# Patient Record
Sex: Female | Born: 1960 | Race: White | Hispanic: No | Marital: Married
Health system: Southern US, Community
[De-identification: ages and names within clinical notes are randomized; demographics above are authoritative.]

---

## 2004-07-31 ENCOUNTER — Ambulatory Visit (HOSPITAL_COMMUNITY): Admission: RE | Admit: 2004-07-31 | Discharge: 2004-07-31 | Payer: Self-pay | Admitting: Neurological Surgery

## 2004-08-29 ENCOUNTER — Ambulatory Visit: Payer: Self-pay | Admitting: Physical Medicine & Rehabilitation

## 2004-08-29 ENCOUNTER — Inpatient Hospital Stay (HOSPITAL_COMMUNITY): Admission: RE | Admit: 2004-08-29 | Discharge: 2004-09-04 | Payer: Self-pay | Admitting: Neurological Surgery

## 2004-09-04 ENCOUNTER — Inpatient Hospital Stay (HOSPITAL_COMMUNITY)
Admission: RE | Admit: 2004-09-04 | Discharge: 2004-09-10 | Payer: Self-pay | Admitting: Physical Medicine & Rehabilitation

## 2004-09-19 ENCOUNTER — Ambulatory Visit: Payer: Self-pay | Admitting: Internal Medicine

## 2004-09-19 ENCOUNTER — Inpatient Hospital Stay (HOSPITAL_COMMUNITY): Admission: AD | Admit: 2004-09-19 | Discharge: 2004-09-25 | Payer: Self-pay | Admitting: Neurological Surgery

## 2005-10-05 ENCOUNTER — Ambulatory Visit: Payer: Self-pay | Admitting: Family Medicine

## 2006-02-09 ENCOUNTER — Encounter: Admission: RE | Admit: 2006-02-09 | Discharge: 2006-02-09 | Payer: Self-pay | Admitting: Anesthesiology

## 2006-03-10 ENCOUNTER — Ambulatory Visit: Payer: Self-pay | Admitting: Oncology

## 2006-05-26 ENCOUNTER — Ambulatory Visit: Payer: Self-pay | Admitting: Oncology

## 2006-09-22 ENCOUNTER — Ambulatory Visit: Payer: Self-pay | Admitting: Oncology

## 2006-11-17 ENCOUNTER — Ambulatory Visit: Payer: Self-pay | Admitting: Oncology

## 2007-01-26 ENCOUNTER — Ambulatory Visit: Payer: Self-pay | Admitting: Oncology

## 2007-07-13 ENCOUNTER — Ambulatory Visit: Payer: Self-pay | Admitting: Oncology

## 2007-07-13 ENCOUNTER — Ambulatory Visit (HOSPITAL_COMMUNITY): Admission: RE | Admit: 2007-07-13 | Discharge: 2007-07-13 | Payer: Self-pay | Admitting: Anesthesiology

## 2008-04-20 ENCOUNTER — Encounter: Admission: RE | Admit: 2008-04-20 | Discharge: 2008-04-20 | Payer: Self-pay | Admitting: Anesthesiology

## 2010-03-26 ENCOUNTER — Inpatient Hospital Stay (HOSPITAL_COMMUNITY): Admission: RE | Admit: 2010-03-26 | Discharge: 2010-04-04 | Payer: Self-pay | Admitting: Neurological Surgery

## 2010-03-28 ENCOUNTER — Ambulatory Visit: Payer: Self-pay | Admitting: Physical Medicine & Rehabilitation

## 2010-11-10 ENCOUNTER — Encounter: Payer: Self-pay | Admitting: Anesthesiology

## 2010-12-27 IMAGING — RF DG LUMBAR SPINE 2-3V
1 series · 2 of 2 positions shown · non-contrast
Comparison: 07/13/2007

CLINICAL DATA: L3-5 PLIF.

LUMBAR SPINE - 2-3 VIEW

[Series 1: run · 2 of 2 slices shown]
[im 1/2]
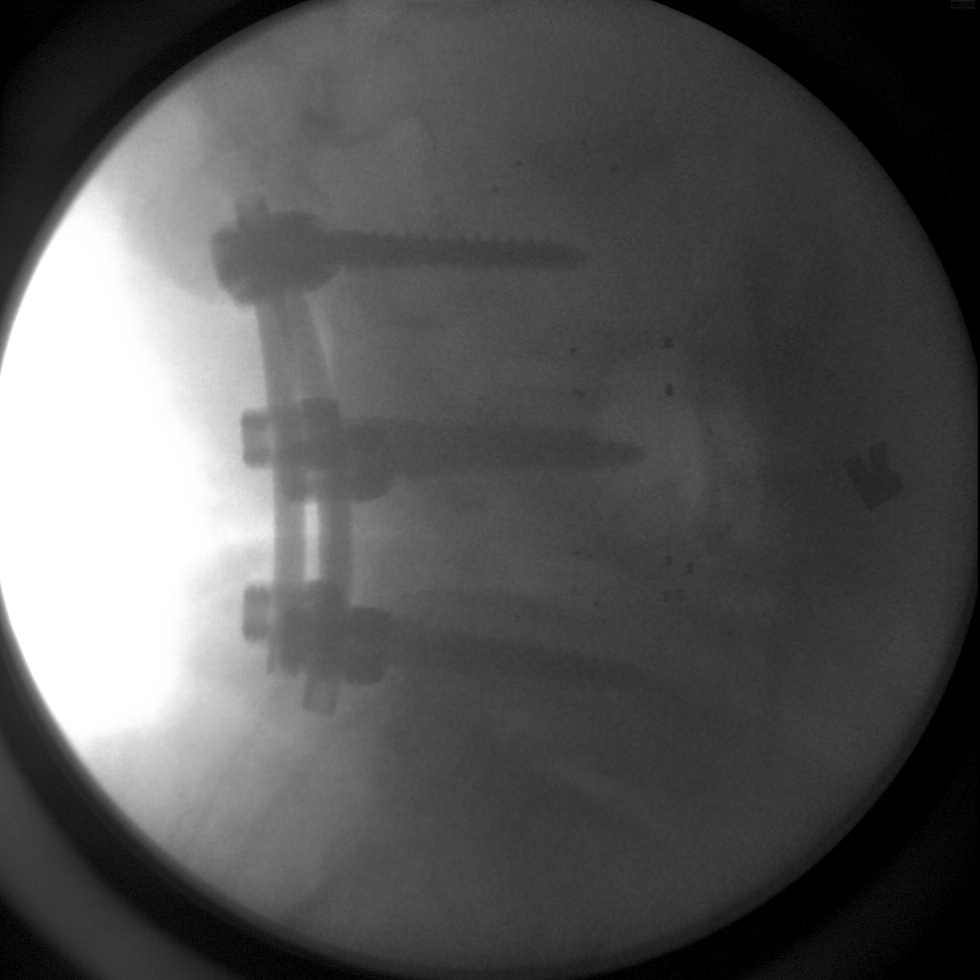
[im 2/2]
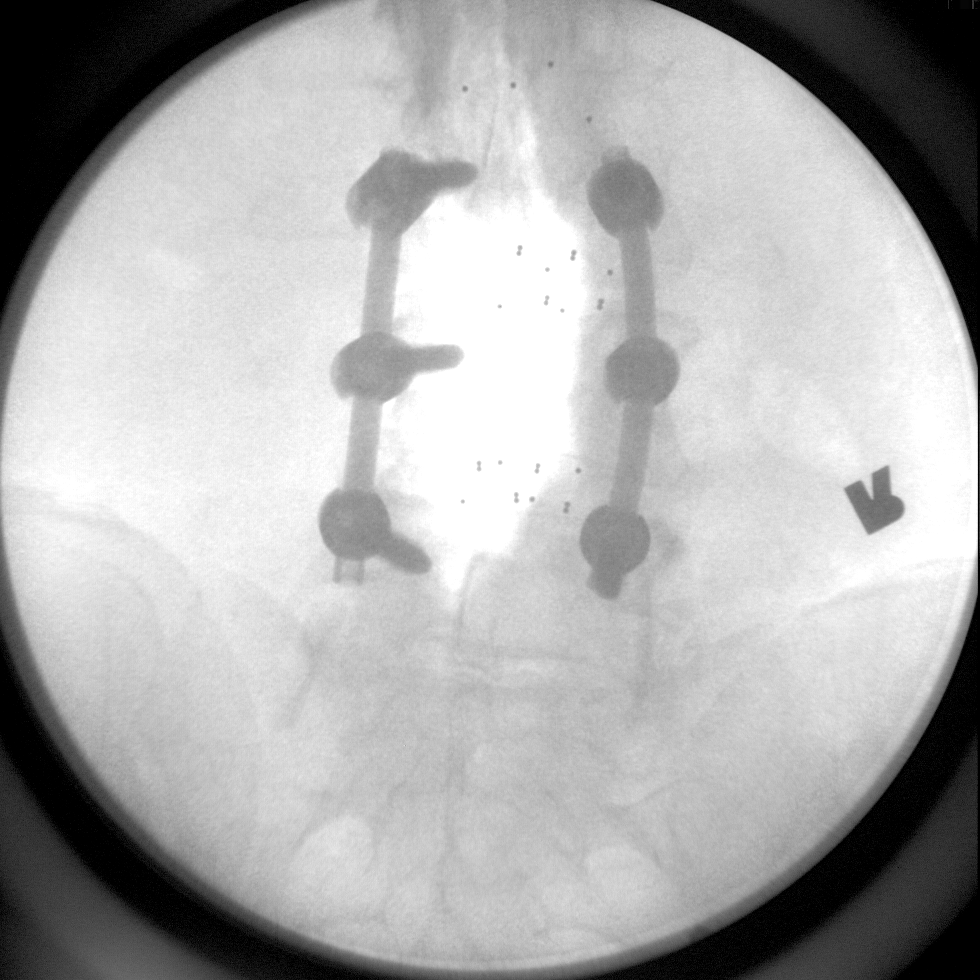

[2 of 2 positions shown; findings below may reference images not displayed]

FINDINGS: Two intraoperative fluoroscopic spot views of the lumbar
spine are submitted.  There is a three-level fusion, which appears
to be at L3-L5.  Interbody spacers are seen at L2-3 through L4-5.
IMPRESSION: L3-5 fusion.  Osseous detail is degraded by technique.

## 2011-01-05 LAB — HEPARIN LEVEL (UNFRACTIONATED): Heparin Unfractionated: <0.1 IU/mL — ABNORMAL LOW (ref 0.30–0.70)

## 2011-01-06 LAB — BASIC METABOLIC PANEL
BUN: 5 mg/dL — ABNORMAL LOW (ref 6–23)
BUN: 7 mg/dL (ref 6–23)
BUN: 6 mg/dL (ref 6–23)
CO2: 27 mEq/L (ref 19–32)
CO2: 33 mEq/L — ABNORMAL HIGH (ref 19–32)
CO2: 33 mEq/L — ABNORMAL HIGH (ref 19–32)
Calcium: 8 mg/dL — ABNORMAL LOW (ref 8.4–10.5)
Calcium: 8.5 mg/dL (ref 8.4–10.5)
Calcium: 9.2 mg/dL (ref 8.4–10.5)
Chloride: 102 mEq/L (ref 96–112)
Chloride: 103 mEq/L (ref 96–112)
Chloride: 96 mEq/L (ref 96–112)
Creatinine, Ser: 0.56 mg/dL (ref 0.4–1.2)
Creatinine, Ser: 0.57 mg/dL (ref 0.4–1.2)
Creatinine, Ser: 0.8 mg/dL (ref 0.4–1.2)
GFR calc Af Amer: >60 mL/min (ref >60)
GFR calc Af Amer: >60 mL/min (ref >60)
GFR calc Af Amer: >60 mL/min (ref >60)
GFR calc non Af Amer: >60 mL/min (ref >60)
GFR calc non Af Amer: >60 mL/min (ref >60)
GFR calc non Af Amer: >60 mL/min (ref >60)
Glucose, Bld: 116 mg/dL — ABNORMAL HIGH (ref 70–99)
Glucose, Bld: 125 mg/dL — ABNORMAL HIGH (ref 70–99)
Glucose, Bld: 109 mg/dL — ABNORMAL HIGH (ref 70–99)
Potassium: 3.7 mEq/L (ref 3.5–5.1)
Potassium: 4.5 mEq/L (ref 3.5–5.1)
Potassium: 3.8 mEq/L (ref 3.5–5.1)
Sodium: 138 mEq/L (ref 135–145)
Sodium: 138 mEq/L (ref 135–145)
Sodium: 135 mEq/L (ref 135–145)

## 2011-01-06 LAB — TYPE AND SCREEN
ABO/RH(D): A POS
Antibody Screen: NEGATIVE

## 2011-01-06 LAB — CBC
HCT: 28 % — ABNORMAL LOW (ref 36.0–46.0)
HCT: 28 % — ABNORMAL LOW (ref 36.0–46.0)
HCT: 28.3 % — ABNORMAL LOW (ref 36.0–46.0)
HCT: 29.8 % — ABNORMAL LOW (ref 36.0–46.0)
HCT: 33.3 % — ABNORMAL LOW (ref 36.0–46.0)
HCT: 40.9 % (ref 36.0–46.0)
Hemoglobin: 10.1 g/dL — ABNORMAL LOW (ref 12.0–15.0)
Hemoglobin: 11.8 g/dL — ABNORMAL LOW (ref 12.0–15.0)
Hemoglobin: 9.5 g/dL — ABNORMAL LOW (ref 12.0–15.0)
Hemoglobin: 9.6 g/dL — ABNORMAL LOW (ref 12.0–15.0)
Hemoglobin: 9.7 g/dL — ABNORMAL LOW (ref 12.0–15.0)
Hemoglobin: 14.1 g/dL (ref 12.0–15.0)
MCHC: 33.8 g/dL (ref 30.0–36.0)
MCHC: 33.8 g/dL (ref 30.0–36.0)
MCHC: 34 g/dL (ref 30.0–36.0)
MCHC: 34.6 g/dL (ref 30.0–36.0)
MCHC: 35.3 g/dL (ref 30.0–36.0)
MCHC: 34.4 g/dL (ref 30.0–36.0)
MCV: 94 fL (ref 78.0–100.0)
MCV: 95.2 fL (ref 78.0–100.0)
MCV: 95.3 fL (ref 78.0–100.0)
MCV: 95.7 fL (ref 78.0–100.0)
MCV: 96.1 fL (ref 78.0–100.0)
MCV: 96.1 fL (ref 78.0–100.0)
Platelets: 57 10*3/uL — ABNORMAL LOW (ref 150–400)
Platelets: 63 10*3/uL — ABNORMAL LOW (ref 150–400)
Platelets: 79 10*3/uL — ABNORMAL LOW (ref 150–400)
Platelets: 83 10*3/uL — ABNORMAL LOW (ref 150–400)
Platelets: 85 10*3/uL — ABNORMAL LOW (ref 150–400)
Platelets: 67 10*3/uL — ABNORMAL LOW (ref 150–400)
RBC: 2.93 MIL/uL — ABNORMAL LOW (ref 3.87–5.11)
RBC: 2.94 MIL/uL — ABNORMAL LOW (ref 3.87–5.11)
RBC: 2.95 MIL/uL — ABNORMAL LOW (ref 3.87–5.11)
RBC: 3.13 MIL/uL — ABNORMAL LOW (ref 3.87–5.11)
RBC: 3.54 MIL/uL — ABNORMAL LOW (ref 3.87–5.11)
RBC: 4.25 MIL/uL (ref 3.87–5.11)
RDW: 15.1 % (ref 11.5–15.5)
RDW: 15.1 % (ref 11.5–15.5)
RDW: 15.4 % (ref 11.5–15.5)
RDW: 15.5 % (ref 11.5–15.5)
RDW: 15.5 % (ref 11.5–15.5)
RDW: 15.6 % — ABNORMAL HIGH (ref 11.5–15.5)
WBC: 3.6 10*3/uL — ABNORMAL LOW (ref 4.0–10.5)
WBC: 4.2 10*3/uL (ref 4.0–10.5)
WBC: 4.3 10*3/uL (ref 4.0–10.5)
WBC: 4.5 10*3/uL (ref 4.0–10.5)
WBC: 5.8 10*3/uL (ref 4.0–10.5)
WBC: 4.8 10*3/uL (ref 4.0–10.5)

## 2011-01-06 LAB — PREPARE PLATELETS

## 2011-01-06 LAB — ABO/RH: ABO/RH(D): A POS

## 2011-01-06 LAB — SURGICAL PCR SCREEN
MRSA, PCR: NEGATIVE
Staphylococcus aureus: NEGATIVE

## 2011-01-06 LAB — POCT I-STAT 4, (NA,K, GLUC, HGB,HCT)
Glucose, Bld: 136 mg/dL — ABNORMAL HIGH (ref 70–99)
HCT: 31 % — ABNORMAL LOW (ref 36.0–46.0)
Hemoglobin: 10.5 g/dL — ABNORMAL LOW (ref 12.0–15.0)
Potassium: 4.3 mEq/L (ref 3.5–5.1)
Sodium: 141 mEq/L (ref 135–145)

## 2011-01-06 LAB — HEPATIC FUNCTION PANEL
ALT: 58 U/L — ABNORMAL HIGH (ref 0–35)
AST: 57 U/L — ABNORMAL HIGH (ref 0–37)
Albumin: 3.5 g/dL (ref 3.5–5.2)
Alkaline Phosphatase: 77 U/L (ref 39–117)
Bilirubin, Direct: <0.1 mg/dL (ref 0.0–0.3)
Total Bilirubin: 0.2 mg/dL — ABNORMAL LOW (ref 0.3–1.2)
Total Protein: 7.1 g/dL (ref 6.0–8.3)

## 2011-03-07 NOTE — Discharge Summary (Signed)
NAMEMARISHKA, [name]             ACCOUNT NO.:  [id number]   MEDICAL RECORD NO.:  [id number]          PATIENT TYPE:  INP   LOCATION:  3038                         FACILITY:  [facility]   PHYSICIAN:  Stefani Dama, M.D.  DATE OF BIRTH:  January 12, 1961   DATE OF ADMISSION:  09/19/2004  DATE OF DISCHARGE:  09/25/2004                                 DISCHARGE SUMMARY   ADMISSION DIAGNOSES:  1.  Status post lumbar decompression and arthrodesis at T12, L1, L3 and L4.  2.  Methicillin-sensitive Staphylococcus aureus wound infection.   MAJOR OPERATION:  Debridement of superficial wound infection on September 19, 2004.   CONDITION ON DISCHARGE:  Improving.   HOSPITAL COURSE:  [name] is a 50 year old individual who underwent  surgical decompression of T12-L1 and L3-L4 on the 11th of November.  She  tolerated that procedure well though she had a prolonged hospital stay  because she is quite debilitated; however, she was discharged home  ambulating independently on oral pain medications.  She has been on a  methadone maintenance program and has a significant history of Crohn's  disease and was maintained on Imuran through the time of the surgery.  Postoperatively, the patient developed some redness around the area of the  incision and I had seen her in the office on the 30th of November and it was  noted that she had some superficial cellulitis.  I started her on some  antibiotics; however, the next day she noted some drainage from the wound  and I advised admission to the hospital. After surgical debridement of the  wound, I noted that only the superficial tissues were involved.  She was  treated with a VAC dressing.  For the first 3 days postoperatively, she was  maintained on vancomycin.  Cultures ultimately returned methicillin-  sensitive Staphylococcus aureus.  She was changed to Ancef.  Dressing  changes were done daily for debridement purposes; however, at this time it  is felt that  the patient is a candidate for calcium alginate dressings which  will be performed at home.  A PICC line has been placed and she will be on  Ancef 1 g IV q.6h. for the next 6 weeks period of time.  She will be seen in  the office over the next 2 weeks time for a further checkup.  She has been  advised as to her activities.  It should be noted that she did not have  evidence of an elevated white count at the time of admission.  Her  sedimentation rate was noted to be 66 at the time of admission.  She has  remained afebrile without a white count during this hospitalization.      Henr   HJE/MEDQ  D:  09/25/2004  T:  09/25/2004  Job:  [id number]

## 2011-03-07 NOTE — Op Note (Signed)
[name], [name]             ACCOUNT NO.:  [id number]   MEDICAL RECORD NO.:  [id number]          PATIENT TYPE:  INP   LOCATION:  3038                         FACILITY:  [facility]   PHYSICIAN:  Stefani Dama, M.D.  DATE OF BIRTH:  May 02, 1961   DATE OF PROCEDURE:  09/19/2004  DATE OF DISCHARGE:                                 OPERATIVE REPORT   PREOPERATIVE DIAGNOSIS:  Wound infection status post lumbar decompressive  surgery on August 29, 2004.   PROCEDURE:  Debridement of superficial lumbar wound.   SURGEON:  Stefani Dama, M.D.   ANESTHESIA:  General endotracheal anesthesia.   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  She tolerated the  procedure well.  She did, however, require a stay in the rehabilitation unit  as the patient was slow to mobilize and ambulate independently.  In any  event, she gradually improved and was discharged home on September 11, 2004.  She was seen in the office on September 18, 2004, and it appeared that in the  mid portion of her incision she had some erythema suggesting some  superficial cellulitis and started her Keflex at that time.  She called  today indicating that she was having drainage from the mid portion of the  lumbar spine and I advised that she be admitted to the hospital. She is now  taken to the operating room to undergo surgical debridement of what appears  to be a superficial wound infection.  She has not had any fever, though she  has had an increased amount of back pain in the very recent past.  Some  laboratory work is still pending at this time.   DESCRIPTION OF PROCEDURE:  The patient was brought to the operating room  supine on the stretcher.  After the smooth induction of general endotracheal  anesthesia, she was turned prone.  The back was prepped with Betadine  solution,  scrubbing to remove some eschar in the region of the incision.  She was then draped sterilely and the wound was explored using a pair of  Metzenbaum scissors in the small opening in the wound to open and release  several of the Vicryl sutures in the subcuticular and subcutaneous regions.  The wound was noted to have some local pus, however, the amount of pus was  only in the superficial region of the wound and did not appear to extend  below the layer of the superficial fascia.  The wound was explored both  cephalad and caudad and other area of some eschar in the region of the skin  was opened and this was also debrided.  Sharp debridement of the skin edges  was undertaken with the #10 blade and once all the devitalized tissue was  debrided from the skin edges.  The wound was copiously irrigated with  antibiotic irrigating solution.  It was then packed several times in the  process of the debridement and ultimately was packed with two 4x4 sponges  that had been soaked in Bacitracin.  Ultimately a dry dressing was placed  over the packed sponges.  This was 4x4s and ABD pad.  The patient had  sterile occlusive  dressing tape placed on the wound.  Tomorrow the patient will be fitted for  a VAC drainage system.  She will be started on vancomycin antibiotics in the  postoperative period.  Cultures were taken in the wound at the time of  surgery.      Henr   HJE/MEDQ  D:  09/19/2004  T:  09/20/2004  Job:  [id number]

## 2014-10-27 DIAGNOSIS — G894 Chronic pain syndrome: Secondary | ICD-10-CM | POA: Diagnosis not present

## 2014-10-27 DIAGNOSIS — M961 Postlaminectomy syndrome, not elsewhere classified: Secondary | ICD-10-CM | POA: Diagnosis not present

## 2014-10-27 DIAGNOSIS — Z79891 Long term (current) use of opiate analgesic: Secondary | ICD-10-CM | POA: Diagnosis not present

## 2014-10-27 DIAGNOSIS — E1142 Type 2 diabetes mellitus with diabetic polyneuropathy: Secondary | ICD-10-CM | POA: Diagnosis not present

## 2014-11-07 DIAGNOSIS — I10 Essential (primary) hypertension: Secondary | ICD-10-CM | POA: Diagnosis not present

## 2014-11-07 DIAGNOSIS — E1129 Type 2 diabetes mellitus with other diabetic kidney complication: Secondary | ICD-10-CM | POA: Diagnosis not present

## 2014-11-07 DIAGNOSIS — E785 Hyperlipidemia, unspecified: Secondary | ICD-10-CM | POA: Diagnosis not present

## 2014-11-07 DIAGNOSIS — R809 Proteinuria, unspecified: Secondary | ICD-10-CM | POA: Diagnosis not present

## 2014-11-07 DIAGNOSIS — E1165 Type 2 diabetes mellitus with hyperglycemia: Secondary | ICD-10-CM | POA: Diagnosis not present

## 2014-11-24 DIAGNOSIS — E1142 Type 2 diabetes mellitus with diabetic polyneuropathy: Secondary | ICD-10-CM | POA: Diagnosis not present

## 2014-11-24 DIAGNOSIS — Z79891 Long term (current) use of opiate analgesic: Secondary | ICD-10-CM | POA: Diagnosis not present

## 2014-11-24 DIAGNOSIS — G894 Chronic pain syndrome: Secondary | ICD-10-CM | POA: Diagnosis not present

## 2014-11-24 DIAGNOSIS — M961 Postlaminectomy syndrome, not elsewhere classified: Secondary | ICD-10-CM | POA: Diagnosis not present

## 2014-12-22 DIAGNOSIS — M961 Postlaminectomy syndrome, not elsewhere classified: Secondary | ICD-10-CM | POA: Diagnosis not present

## 2014-12-22 DIAGNOSIS — E1142 Type 2 diabetes mellitus with diabetic polyneuropathy: Secondary | ICD-10-CM | POA: Diagnosis not present

## 2014-12-22 DIAGNOSIS — G894 Chronic pain syndrome: Secondary | ICD-10-CM | POA: Diagnosis not present

## 2014-12-22 DIAGNOSIS — Z79891 Long term (current) use of opiate analgesic: Secondary | ICD-10-CM | POA: Diagnosis not present

## 2015-01-03 DIAGNOSIS — K7460 Unspecified cirrhosis of liver: Secondary | ICD-10-CM | POA: Diagnosis not present

## 2015-01-03 DIAGNOSIS — D62 Acute posthemorrhagic anemia: Secondary | ICD-10-CM | POA: Diagnosis not present

## 2015-01-03 DIAGNOSIS — R079 Chest pain, unspecified: Secondary | ICD-10-CM | POA: Diagnosis not present

## 2015-01-03 DIAGNOSIS — Z87442 Personal history of urinary calculi: Secondary | ICD-10-CM | POA: Diagnosis not present

## 2015-01-03 DIAGNOSIS — K921 Melena: Secondary | ICD-10-CM | POA: Diagnosis not present

## 2015-01-03 DIAGNOSIS — N179 Acute kidney failure, unspecified: Secondary | ICD-10-CM | POA: Diagnosis not present

## 2015-01-03 DIAGNOSIS — D649 Anemia, unspecified: Secondary | ICD-10-CM | POA: Diagnosis not present

## 2015-01-03 DIAGNOSIS — E871 Hypo-osmolality and hyponatremia: Secondary | ICD-10-CM | POA: Diagnosis not present

## 2015-01-03 DIAGNOSIS — R1030 Lower abdominal pain, unspecified: Secondary | ICD-10-CM | POA: Diagnosis not present

## 2015-01-03 DIAGNOSIS — K625 Hemorrhage of anus and rectum: Secondary | ICD-10-CM | POA: Diagnosis not present

## 2015-01-03 DIAGNOSIS — K219 Gastro-esophageal reflux disease without esophagitis: Secondary | ICD-10-CM | POA: Diagnosis not present

## 2015-01-03 DIAGNOSIS — K580 Irritable bowel syndrome with diarrhea: Secondary | ICD-10-CM | POA: Diagnosis not present

## 2015-01-03 DIAGNOSIS — R58 Hemorrhage, not elsewhere classified: Secondary | ICD-10-CM | POA: Diagnosis not present

## 2015-01-03 DIAGNOSIS — E780 Pure hypercholesterolemia: Secondary | ICD-10-CM | POA: Diagnosis not present

## 2015-01-03 DIAGNOSIS — R509 Fever, unspecified: Secondary | ICD-10-CM | POA: Diagnosis not present

## 2015-01-03 DIAGNOSIS — I959 Hypotension, unspecified: Secondary | ICD-10-CM | POA: Diagnosis not present

## 2015-01-03 DIAGNOSIS — R198 Other specified symptoms and signs involving the digestive system and abdomen: Secondary | ICD-10-CM | POA: Diagnosis not present

## 2015-01-03 DIAGNOSIS — R000 Tachycardia, unspecified: Secondary | ICD-10-CM | POA: Diagnosis not present

## 2015-01-03 DIAGNOSIS — K922 Gastrointestinal hemorrhage, unspecified: Secondary | ICD-10-CM | POA: Diagnosis not present

## 2015-01-09 DIAGNOSIS — K589 Irritable bowel syndrome without diarrhea: Secondary | ICD-10-CM | POA: Diagnosis not present

## 2015-01-09 DIAGNOSIS — K7460 Unspecified cirrhosis of liver: Secondary | ICD-10-CM | POA: Diagnosis not present

## 2015-01-09 DIAGNOSIS — K219 Gastro-esophageal reflux disease without esophagitis: Secondary | ICD-10-CM | POA: Diagnosis not present

## 2015-01-09 DIAGNOSIS — K648 Other hemorrhoids: Secondary | ICD-10-CM | POA: Diagnosis not present

## 2015-01-11 DIAGNOSIS — Z9889 Other specified postprocedural states: Secondary | ICD-10-CM | POA: Diagnosis not present

## 2015-01-11 DIAGNOSIS — R1030 Lower abdominal pain, unspecified: Secondary | ICD-10-CM | POA: Diagnosis not present

## 2015-01-11 DIAGNOSIS — K7460 Unspecified cirrhosis of liver: Secondary | ICD-10-CM | POA: Diagnosis not present

## 2015-01-11 DIAGNOSIS — R112 Nausea with vomiting, unspecified: Secondary | ICD-10-CM | POA: Diagnosis not present

## 2015-01-11 DIAGNOSIS — R161 Splenomegaly, not elsewhere classified: Secondary | ICD-10-CM | POA: Diagnosis not present

## 2015-01-19 DIAGNOSIS — Z79891 Long term (current) use of opiate analgesic: Secondary | ICD-10-CM | POA: Diagnosis not present

## 2015-01-19 DIAGNOSIS — M961 Postlaminectomy syndrome, not elsewhere classified: Secondary | ICD-10-CM | POA: Diagnosis not present

## 2015-01-19 DIAGNOSIS — G894 Chronic pain syndrome: Secondary | ICD-10-CM | POA: Diagnosis not present

## 2015-01-19 DIAGNOSIS — E1142 Type 2 diabetes mellitus with diabetic polyneuropathy: Secondary | ICD-10-CM | POA: Diagnosis not present

## 2015-02-01 DIAGNOSIS — E538 Deficiency of other specified B group vitamins: Secondary | ICD-10-CM | POA: Diagnosis not present

## 2015-02-01 DIAGNOSIS — I10 Essential (primary) hypertension: Secondary | ICD-10-CM | POA: Diagnosis not present

## 2015-02-01 DIAGNOSIS — E1140 Type 2 diabetes mellitus with diabetic neuropathy, unspecified: Secondary | ICD-10-CM | POA: Diagnosis not present

## 2015-02-01 DIAGNOSIS — E785 Hyperlipidemia, unspecified: Secondary | ICD-10-CM | POA: Diagnosis not present

## 2015-02-01 DIAGNOSIS — K7460 Unspecified cirrhosis of liver: Secondary | ICD-10-CM | POA: Diagnosis not present

## 2015-02-09 DIAGNOSIS — J208 Acute bronchitis due to other specified organisms: Secondary | ICD-10-CM | POA: Diagnosis not present

## 2015-02-16 DIAGNOSIS — G894 Chronic pain syndrome: Secondary | ICD-10-CM | POA: Diagnosis not present

## 2015-02-16 DIAGNOSIS — M961 Postlaminectomy syndrome, not elsewhere classified: Secondary | ICD-10-CM | POA: Diagnosis not present

## 2015-02-16 DIAGNOSIS — Z79891 Long term (current) use of opiate analgesic: Secondary | ICD-10-CM | POA: Diagnosis not present

## 2015-02-16 DIAGNOSIS — E1142 Type 2 diabetes mellitus with diabetic polyneuropathy: Secondary | ICD-10-CM | POA: Diagnosis not present

## 2015-02-21 DIAGNOSIS — E871 Hypo-osmolality and hyponatremia: Secondary | ICD-10-CM | POA: Diagnosis not present

## 2015-03-08 DIAGNOSIS — R809 Proteinuria, unspecified: Secondary | ICD-10-CM | POA: Diagnosis not present

## 2015-03-08 DIAGNOSIS — E1129 Type 2 diabetes mellitus with other diabetic kidney complication: Secondary | ICD-10-CM | POA: Diagnosis not present

## 2015-03-08 DIAGNOSIS — I10 Essential (primary) hypertension: Secondary | ICD-10-CM | POA: Diagnosis not present

## 2015-03-08 DIAGNOSIS — E785 Hyperlipidemia, unspecified: Secondary | ICD-10-CM | POA: Diagnosis not present

## 2015-03-08 DIAGNOSIS — E1165 Type 2 diabetes mellitus with hyperglycemia: Secondary | ICD-10-CM | POA: Diagnosis not present

## 2015-03-15 DIAGNOSIS — Z79891 Long term (current) use of opiate analgesic: Secondary | ICD-10-CM | POA: Diagnosis not present

## 2015-03-16 DIAGNOSIS — E1142 Type 2 diabetes mellitus with diabetic polyneuropathy: Secondary | ICD-10-CM | POA: Diagnosis not present

## 2015-03-16 DIAGNOSIS — Z79891 Long term (current) use of opiate analgesic: Secondary | ICD-10-CM | POA: Diagnosis not present

## 2015-03-16 DIAGNOSIS — E871 Hypo-osmolality and hyponatremia: Secondary | ICD-10-CM | POA: Diagnosis not present

## 2015-03-16 DIAGNOSIS — G894 Chronic pain syndrome: Secondary | ICD-10-CM | POA: Diagnosis not present

## 2015-03-16 DIAGNOSIS — M961 Postlaminectomy syndrome, not elsewhere classified: Secondary | ICD-10-CM | POA: Diagnosis not present

## 2015-03-27 DIAGNOSIS — E871 Hypo-osmolality and hyponatremia: Secondary | ICD-10-CM | POA: Diagnosis not present

## 2015-04-13 DIAGNOSIS — M961 Postlaminectomy syndrome, not elsewhere classified: Secondary | ICD-10-CM | POA: Diagnosis not present

## 2015-04-13 DIAGNOSIS — E1142 Type 2 diabetes mellitus with diabetic polyneuropathy: Secondary | ICD-10-CM | POA: Diagnosis not present

## 2015-04-13 DIAGNOSIS — Z79891 Long term (current) use of opiate analgesic: Secondary | ICD-10-CM | POA: Diagnosis not present

## 2015-04-13 DIAGNOSIS — G894 Chronic pain syndrome: Secondary | ICD-10-CM | POA: Diagnosis not present

## 2015-04-18 DIAGNOSIS — H26493 Other secondary cataract, bilateral: Secondary | ICD-10-CM | POA: Diagnosis not present

## 2015-05-11 DIAGNOSIS — G894 Chronic pain syndrome: Secondary | ICD-10-CM | POA: Diagnosis not present

## 2015-06-04 DIAGNOSIS — E1140 Type 2 diabetes mellitus with diabetic neuropathy, unspecified: Secondary | ICD-10-CM | POA: Diagnosis not present

## 2015-06-04 DIAGNOSIS — Z0000 Encounter for general adult medical examination without abnormal findings: Secondary | ICD-10-CM | POA: Diagnosis not present

## 2015-06-04 DIAGNOSIS — Z01419 Encounter for gynecological examination (general) (routine) without abnormal findings: Secondary | ICD-10-CM | POA: Diagnosis not present

## 2015-06-04 DIAGNOSIS — I10 Essential (primary) hypertension: Secondary | ICD-10-CM | POA: Diagnosis not present

## 2015-06-04 DIAGNOSIS — Z1389 Encounter for screening for other disorder: Secondary | ICD-10-CM | POA: Diagnosis not present

## 2015-06-04 DIAGNOSIS — E785 Hyperlipidemia, unspecified: Secondary | ICD-10-CM | POA: Diagnosis not present

## 2015-06-08 DIAGNOSIS — G894 Chronic pain syndrome: Secondary | ICD-10-CM | POA: Diagnosis not present

## 2015-07-06 DIAGNOSIS — M961 Postlaminectomy syndrome, not elsewhere classified: Secondary | ICD-10-CM | POA: Diagnosis not present

## 2015-07-06 DIAGNOSIS — G894 Chronic pain syndrome: Secondary | ICD-10-CM | POA: Diagnosis not present

## 2015-07-06 DIAGNOSIS — Z79891 Long term (current) use of opiate analgesic: Secondary | ICD-10-CM | POA: Diagnosis not present

## 2015-07-06 DIAGNOSIS — E1142 Type 2 diabetes mellitus with diabetic polyneuropathy: Secondary | ICD-10-CM | POA: Diagnosis not present

## 2015-07-09 DIAGNOSIS — K7460 Unspecified cirrhosis of liver: Secondary | ICD-10-CM | POA: Diagnosis not present

## 2015-07-16 DIAGNOSIS — R809 Proteinuria, unspecified: Secondary | ICD-10-CM | POA: Diagnosis not present

## 2015-07-16 DIAGNOSIS — E1129 Type 2 diabetes mellitus with other diabetic kidney complication: Secondary | ICD-10-CM | POA: Diagnosis not present

## 2015-07-16 DIAGNOSIS — I10 Essential (primary) hypertension: Secondary | ICD-10-CM | POA: Diagnosis not present

## 2015-07-16 DIAGNOSIS — E785 Hyperlipidemia, unspecified: Secondary | ICD-10-CM | POA: Diagnosis not present

## 2015-07-16 DIAGNOSIS — E669 Obesity, unspecified: Secondary | ICD-10-CM | POA: Diagnosis not present

## 2015-08-03 DIAGNOSIS — Z79891 Long term (current) use of opiate analgesic: Secondary | ICD-10-CM | POA: Diagnosis not present

## 2015-08-03 DIAGNOSIS — E1142 Type 2 diabetes mellitus with diabetic polyneuropathy: Secondary | ICD-10-CM | POA: Diagnosis not present

## 2015-08-03 DIAGNOSIS — G894 Chronic pain syndrome: Secondary | ICD-10-CM | POA: Diagnosis not present

## 2015-08-03 DIAGNOSIS — M961 Postlaminectomy syndrome, not elsewhere classified: Secondary | ICD-10-CM | POA: Diagnosis not present

## 2015-08-11 DIAGNOSIS — R161 Splenomegaly, not elsewhere classified: Secondary | ICD-10-CM | POA: Diagnosis not present

## 2015-08-11 DIAGNOSIS — K7460 Unspecified cirrhosis of liver: Secondary | ICD-10-CM | POA: Diagnosis not present

## 2015-08-30 DIAGNOSIS — Z1231 Encounter for screening mammogram for malignant neoplasm of breast: Secondary | ICD-10-CM | POA: Diagnosis not present

## 2015-08-31 DIAGNOSIS — M961 Postlaminectomy syndrome, not elsewhere classified: Secondary | ICD-10-CM | POA: Diagnosis not present

## 2015-08-31 DIAGNOSIS — Z79891 Long term (current) use of opiate analgesic: Secondary | ICD-10-CM | POA: Diagnosis not present

## 2015-08-31 DIAGNOSIS — E1142 Type 2 diabetes mellitus with diabetic polyneuropathy: Secondary | ICD-10-CM | POA: Diagnosis not present

## 2015-08-31 DIAGNOSIS — G894 Chronic pain syndrome: Secondary | ICD-10-CM | POA: Diagnosis not present

## 2015-09-05 DIAGNOSIS — R1013 Epigastric pain: Secondary | ICD-10-CM | POA: Diagnosis not present

## 2015-09-05 DIAGNOSIS — K7460 Unspecified cirrhosis of liver: Secondary | ICD-10-CM | POA: Diagnosis not present

## 2015-09-21 DIAGNOSIS — Z23 Encounter for immunization: Secondary | ICD-10-CM | POA: Diagnosis not present

## 2015-09-28 DIAGNOSIS — Z79891 Long term (current) use of opiate analgesic: Secondary | ICD-10-CM | POA: Diagnosis not present

## 2015-09-28 DIAGNOSIS — E1142 Type 2 diabetes mellitus with diabetic polyneuropathy: Secondary | ICD-10-CM | POA: Diagnosis not present

## 2015-09-28 DIAGNOSIS — M961 Postlaminectomy syndrome, not elsewhere classified: Secondary | ICD-10-CM | POA: Diagnosis not present

## 2015-09-28 DIAGNOSIS — G894 Chronic pain syndrome: Secondary | ICD-10-CM | POA: Diagnosis not present

## 2015-10-12 DIAGNOSIS — I10 Essential (primary) hypertension: Secondary | ICD-10-CM | POA: Diagnosis not present

## 2015-10-12 DIAGNOSIS — E1140 Type 2 diabetes mellitus with diabetic neuropathy, unspecified: Secondary | ICD-10-CM | POA: Diagnosis not present

## 2015-10-12 DIAGNOSIS — E538 Deficiency of other specified B group vitamins: Secondary | ICD-10-CM | POA: Diagnosis not present

## 2015-10-12 DIAGNOSIS — E785 Hyperlipidemia, unspecified: Secondary | ICD-10-CM | POA: Diagnosis not present

## 2015-10-12 DIAGNOSIS — K7460 Unspecified cirrhosis of liver: Secondary | ICD-10-CM | POA: Diagnosis not present

## 2015-10-24 DIAGNOSIS — H26493 Other secondary cataract, bilateral: Secondary | ICD-10-CM | POA: Diagnosis not present

## 2015-10-24 DIAGNOSIS — E119 Type 2 diabetes mellitus without complications: Secondary | ICD-10-CM | POA: Diagnosis not present

## 2015-10-26 DIAGNOSIS — G894 Chronic pain syndrome: Secondary | ICD-10-CM | POA: Diagnosis not present

## 2015-11-23 DIAGNOSIS — G894 Chronic pain syndrome: Secondary | ICD-10-CM | POA: Diagnosis not present

## 2015-11-26 DIAGNOSIS — I10 Essential (primary) hypertension: Secondary | ICD-10-CM | POA: Diagnosis not present

## 2015-11-26 DIAGNOSIS — E785 Hyperlipidemia, unspecified: Secondary | ICD-10-CM | POA: Diagnosis not present

## 2015-11-26 DIAGNOSIS — E1165 Type 2 diabetes mellitus with hyperglycemia: Secondary | ICD-10-CM | POA: Diagnosis not present

## 2015-11-26 DIAGNOSIS — R809 Proteinuria, unspecified: Secondary | ICD-10-CM | POA: Diagnosis not present

## 2015-12-21 DIAGNOSIS — Z79891 Long term (current) use of opiate analgesic: Secondary | ICD-10-CM | POA: Diagnosis not present

## 2015-12-21 DIAGNOSIS — E1142 Type 2 diabetes mellitus with diabetic polyneuropathy: Secondary | ICD-10-CM | POA: Diagnosis not present

## 2015-12-21 DIAGNOSIS — G894 Chronic pain syndrome: Secondary | ICD-10-CM | POA: Diagnosis not present

## 2015-12-21 DIAGNOSIS — M961 Postlaminectomy syndrome, not elsewhere classified: Secondary | ICD-10-CM | POA: Diagnosis not present

## 2016-01-18 DIAGNOSIS — G894 Chronic pain syndrome: Secondary | ICD-10-CM | POA: Diagnosis not present

## 2016-02-15 DIAGNOSIS — G894 Chronic pain syndrome: Secondary | ICD-10-CM | POA: Diagnosis not present

## 2016-02-15 DIAGNOSIS — Z79891 Long term (current) use of opiate analgesic: Secondary | ICD-10-CM | POA: Diagnosis not present

## 2016-02-15 DIAGNOSIS — E1142 Type 2 diabetes mellitus with diabetic polyneuropathy: Secondary | ICD-10-CM | POA: Diagnosis not present

## 2016-02-15 DIAGNOSIS — M961 Postlaminectomy syndrome, not elsewhere classified: Secondary | ICD-10-CM | POA: Diagnosis not present

## 2016-02-18 DIAGNOSIS — K7460 Unspecified cirrhosis of liver: Secondary | ICD-10-CM | POA: Diagnosis not present

## 2016-02-18 DIAGNOSIS — E1140 Type 2 diabetes mellitus with diabetic neuropathy, unspecified: Secondary | ICD-10-CM | POA: Diagnosis not present

## 2016-02-18 DIAGNOSIS — E785 Hyperlipidemia, unspecified: Secondary | ICD-10-CM | POA: Diagnosis not present

## 2016-02-18 DIAGNOSIS — I10 Essential (primary) hypertension: Secondary | ICD-10-CM | POA: Diagnosis not present

## 2016-02-18 DIAGNOSIS — E538 Deficiency of other specified B group vitamins: Secondary | ICD-10-CM | POA: Diagnosis not present

## 2016-02-25 DIAGNOSIS — I10 Essential (primary) hypertension: Secondary | ICD-10-CM | POA: Diagnosis not present

## 2016-02-25 DIAGNOSIS — R809 Proteinuria, unspecified: Secondary | ICD-10-CM | POA: Diagnosis not present

## 2016-02-25 DIAGNOSIS — I6523 Occlusion and stenosis of bilateral carotid arteries: Secondary | ICD-10-CM | POA: Diagnosis not present

## 2016-02-25 DIAGNOSIS — E1129 Type 2 diabetes mellitus with other diabetic kidney complication: Secondary | ICD-10-CM | POA: Diagnosis not present

## 2016-02-25 DIAGNOSIS — E785 Hyperlipidemia, unspecified: Secondary | ICD-10-CM | POA: Diagnosis not present

## 2016-02-25 DIAGNOSIS — Z794 Long term (current) use of insulin: Secondary | ICD-10-CM | POA: Diagnosis not present

## 2016-02-28 DIAGNOSIS — D649 Anemia, unspecified: Secondary | ICD-10-CM | POA: Diagnosis not present

## 2016-02-28 DIAGNOSIS — K589 Irritable bowel syndrome without diarrhea: Secondary | ICD-10-CM | POA: Diagnosis not present

## 2016-02-28 DIAGNOSIS — K2960 Other gastritis without bleeding: Secondary | ICD-10-CM | POA: Diagnosis not present

## 2016-02-28 DIAGNOSIS — K5090 Crohn's disease, unspecified, without complications: Secondary | ICD-10-CM | POA: Diagnosis not present

## 2016-02-28 DIAGNOSIS — K7460 Unspecified cirrhosis of liver: Secondary | ICD-10-CM | POA: Diagnosis not present

## 2016-03-04 DIAGNOSIS — D509 Iron deficiency anemia, unspecified: Secondary | ICD-10-CM | POA: Diagnosis not present

## 2016-03-06 DIAGNOSIS — K7460 Unspecified cirrhosis of liver: Secondary | ICD-10-CM | POA: Diagnosis not present

## 2016-03-14 DIAGNOSIS — G894 Chronic pain syndrome: Secondary | ICD-10-CM | POA: Diagnosis not present

## 2016-04-11 DIAGNOSIS — G894 Chronic pain syndrome: Secondary | ICD-10-CM | POA: Diagnosis not present

## 2016-05-09 DIAGNOSIS — G894 Chronic pain syndrome: Secondary | ICD-10-CM | POA: Diagnosis not present

## 2016-06-06 DIAGNOSIS — G894 Chronic pain syndrome: Secondary | ICD-10-CM | POA: Diagnosis not present

## 2016-07-03 DIAGNOSIS — E1140 Type 2 diabetes mellitus with diabetic neuropathy, unspecified: Secondary | ICD-10-CM | POA: Diagnosis not present

## 2016-07-03 DIAGNOSIS — Z0000 Encounter for general adult medical examination without abnormal findings: Secondary | ICD-10-CM | POA: Diagnosis not present

## 2016-07-03 DIAGNOSIS — E785 Hyperlipidemia, unspecified: Secondary | ICD-10-CM | POA: Diagnosis not present

## 2016-07-03 DIAGNOSIS — E538 Deficiency of other specified B group vitamins: Secondary | ICD-10-CM | POA: Diagnosis not present

## 2016-07-03 DIAGNOSIS — I10 Essential (primary) hypertension: Secondary | ICD-10-CM | POA: Diagnosis not present

## 2016-07-03 DIAGNOSIS — K7460 Unspecified cirrhosis of liver: Secondary | ICD-10-CM | POA: Diagnosis not present

## 2016-07-04 DIAGNOSIS — G894 Chronic pain syndrome: Secondary | ICD-10-CM | POA: Diagnosis not present

## 2016-07-28 DIAGNOSIS — Z23 Encounter for immunization: Secondary | ICD-10-CM | POA: Diagnosis not present

## 2016-08-01 DIAGNOSIS — G894 Chronic pain syndrome: Secondary | ICD-10-CM | POA: Diagnosis not present

## 2016-08-29 DIAGNOSIS — G894 Chronic pain syndrome: Secondary | ICD-10-CM | POA: Diagnosis not present

## 2016-08-30 DIAGNOSIS — K7460 Unspecified cirrhosis of liver: Secondary | ICD-10-CM | POA: Diagnosis not present

## 2016-09-09 DIAGNOSIS — E118 Type 2 diabetes mellitus with unspecified complications: Secondary | ICD-10-CM | POA: Diagnosis not present

## 2016-09-18 DIAGNOSIS — K589 Irritable bowel syndrome without diarrhea: Secondary | ICD-10-CM | POA: Diagnosis not present

## 2016-09-18 DIAGNOSIS — K5090 Crohn's disease, unspecified, without complications: Secondary | ICD-10-CM | POA: Diagnosis not present

## 2016-09-18 DIAGNOSIS — K7460 Unspecified cirrhosis of liver: Secondary | ICD-10-CM | POA: Diagnosis not present

## 2016-09-18 DIAGNOSIS — K219 Gastro-esophageal reflux disease without esophagitis: Secondary | ICD-10-CM | POA: Diagnosis not present

## 2016-09-18 DIAGNOSIS — D500 Iron deficiency anemia secondary to blood loss (chronic): Secondary | ICD-10-CM | POA: Diagnosis not present

## 2016-09-26 DIAGNOSIS — G894 Chronic pain syndrome: Secondary | ICD-10-CM | POA: Diagnosis not present

## 2016-10-22 DIAGNOSIS — Z1231 Encounter for screening mammogram for malignant neoplasm of breast: Secondary | ICD-10-CM | POA: Diagnosis not present

## 2016-10-27 DIAGNOSIS — H26493 Other secondary cataract, bilateral: Secondary | ICD-10-CM | POA: Diagnosis not present

## 2016-10-27 DIAGNOSIS — E119 Type 2 diabetes mellitus without complications: Secondary | ICD-10-CM | POA: Diagnosis not present

## 2016-10-28 DIAGNOSIS — G894 Chronic pain syndrome: Secondary | ICD-10-CM | POA: Diagnosis not present

## 2016-10-31 DIAGNOSIS — D500 Iron deficiency anemia secondary to blood loss (chronic): Secondary | ICD-10-CM | POA: Diagnosis not present

## 2016-11-03 DIAGNOSIS — E1140 Type 2 diabetes mellitus with diabetic neuropathy, unspecified: Secondary | ICD-10-CM | POA: Diagnosis not present

## 2016-11-03 DIAGNOSIS — K7460 Unspecified cirrhosis of liver: Secondary | ICD-10-CM | POA: Diagnosis not present

## 2016-11-03 DIAGNOSIS — E785 Hyperlipidemia, unspecified: Secondary | ICD-10-CM | POA: Diagnosis not present

## 2016-11-03 DIAGNOSIS — Z13820 Encounter for screening for osteoporosis: Secondary | ICD-10-CM | POA: Diagnosis not present

## 2016-11-03 DIAGNOSIS — M8589 Other specified disorders of bone density and structure, multiple sites: Secondary | ICD-10-CM | POA: Diagnosis not present

## 2016-11-03 DIAGNOSIS — I10 Essential (primary) hypertension: Secondary | ICD-10-CM | POA: Diagnosis not present

## 2016-11-03 DIAGNOSIS — E538 Deficiency of other specified B group vitamins: Secondary | ICD-10-CM | POA: Diagnosis not present

## 2016-11-03 DIAGNOSIS — D696 Thrombocytopenia, unspecified: Secondary | ICD-10-CM | POA: Diagnosis not present

## 2016-11-03 DIAGNOSIS — Z1389 Encounter for screening for other disorder: Secondary | ICD-10-CM | POA: Diagnosis not present

## 2016-11-20 DIAGNOSIS — D500 Iron deficiency anemia secondary to blood loss (chronic): Secondary | ICD-10-CM | POA: Diagnosis not present

## 2016-11-20 DIAGNOSIS — Z1212 Encounter for screening for malignant neoplasm of rectum: Secondary | ICD-10-CM | POA: Diagnosis not present

## 2016-11-21 DIAGNOSIS — G894 Chronic pain syndrome: Secondary | ICD-10-CM | POA: Diagnosis not present

## 2016-11-25 DIAGNOSIS — D500 Iron deficiency anemia secondary to blood loss (chronic): Secondary | ICD-10-CM | POA: Diagnosis not present

## 2016-12-02 DIAGNOSIS — D500 Iron deficiency anemia secondary to blood loss (chronic): Secondary | ICD-10-CM | POA: Diagnosis not present

## 2016-12-19 DIAGNOSIS — G894 Chronic pain syndrome: Secondary | ICD-10-CM | POA: Diagnosis not present

## 2017-01-20 DIAGNOSIS — G894 Chronic pain syndrome: Secondary | ICD-10-CM | POA: Diagnosis not present

## 2017-02-18 DIAGNOSIS — M25551 Pain in right hip: Secondary | ICD-10-CM | POA: Diagnosis not present

## 2017-02-18 DIAGNOSIS — G894 Chronic pain syndrome: Secondary | ICD-10-CM | POA: Diagnosis not present

## 2017-02-18 DIAGNOSIS — M961 Postlaminectomy syndrome, not elsewhere classified: Secondary | ICD-10-CM | POA: Diagnosis not present

## 2017-02-18 DIAGNOSIS — Z79891 Long term (current) use of opiate analgesic: Secondary | ICD-10-CM | POA: Diagnosis not present

## 2017-02-20 DIAGNOSIS — M25551 Pain in right hip: Secondary | ICD-10-CM | POA: Diagnosis not present

## 2017-02-20 DIAGNOSIS — Z981 Arthrodesis status: Secondary | ICD-10-CM | POA: Diagnosis not present

## 2017-02-20 DIAGNOSIS — Z8719 Personal history of other diseases of the digestive system: Secondary | ICD-10-CM | POA: Diagnosis not present

## 2017-02-20 DIAGNOSIS — N2889 Other specified disorders of kidney and ureter: Secondary | ICD-10-CM | POA: Diagnosis not present

## 2017-02-20 DIAGNOSIS — K7460 Unspecified cirrhosis of liver: Secondary | ICD-10-CM | POA: Diagnosis not present

## 2017-02-20 DIAGNOSIS — M1611 Unilateral primary osteoarthritis, right hip: Secondary | ICD-10-CM | POA: Diagnosis not present

## 2017-02-24 DIAGNOSIS — D500 Iron deficiency anemia secondary to blood loss (chronic): Secondary | ICD-10-CM | POA: Diagnosis not present

## 2017-02-24 DIAGNOSIS — K2960 Other gastritis without bleeding: Secondary | ICD-10-CM | POA: Diagnosis not present

## 2017-02-24 DIAGNOSIS — K219 Gastro-esophageal reflux disease without esophagitis: Secondary | ICD-10-CM | POA: Diagnosis not present

## 2017-02-24 DIAGNOSIS — K7460 Unspecified cirrhosis of liver: Secondary | ICD-10-CM | POA: Diagnosis not present

## 2017-02-24 DIAGNOSIS — K589 Irritable bowel syndrome without diarrhea: Secondary | ICD-10-CM | POA: Diagnosis not present

## 2017-03-11 DIAGNOSIS — K7460 Unspecified cirrhosis of liver: Secondary | ICD-10-CM | POA: Diagnosis not present

## 2017-03-11 DIAGNOSIS — E1140 Type 2 diabetes mellitus with diabetic neuropathy, unspecified: Secondary | ICD-10-CM | POA: Diagnosis not present

## 2017-03-11 DIAGNOSIS — E785 Hyperlipidemia, unspecified: Secondary | ICD-10-CM | POA: Diagnosis not present

## 2017-03-11 DIAGNOSIS — D519 Vitamin B12 deficiency anemia, unspecified: Secondary | ICD-10-CM | POA: Diagnosis not present

## 2017-03-11 DIAGNOSIS — I10 Essential (primary) hypertension: Secondary | ICD-10-CM | POA: Diagnosis not present

## 2017-03-19 DIAGNOSIS — Z79891 Long term (current) use of opiate analgesic: Secondary | ICD-10-CM | POA: Diagnosis not present

## 2017-03-19 DIAGNOSIS — M961 Postlaminectomy syndrome, not elsewhere classified: Secondary | ICD-10-CM | POA: Diagnosis not present

## 2017-03-19 DIAGNOSIS — M25551 Pain in right hip: Secondary | ICD-10-CM | POA: Diagnosis not present

## 2017-03-19 DIAGNOSIS — G894 Chronic pain syndrome: Secondary | ICD-10-CM | POA: Diagnosis not present

## 2017-04-20 DIAGNOSIS — M25551 Pain in right hip: Secondary | ICD-10-CM | POA: Diagnosis not present

## 2017-04-20 DIAGNOSIS — G894 Chronic pain syndrome: Secondary | ICD-10-CM | POA: Diagnosis not present

## 2017-04-20 DIAGNOSIS — Z79891 Long term (current) use of opiate analgesic: Secondary | ICD-10-CM | POA: Diagnosis not present

## 2017-04-20 DIAGNOSIS — M961 Postlaminectomy syndrome, not elsewhere classified: Secondary | ICD-10-CM | POA: Diagnosis not present

## 2017-05-21 DIAGNOSIS — Z79891 Long term (current) use of opiate analgesic: Secondary | ICD-10-CM | POA: Diagnosis not present

## 2017-05-21 DIAGNOSIS — M25551 Pain in right hip: Secondary | ICD-10-CM | POA: Diagnosis not present

## 2017-05-21 DIAGNOSIS — M961 Postlaminectomy syndrome, not elsewhere classified: Secondary | ICD-10-CM | POA: Diagnosis not present

## 2017-05-21 DIAGNOSIS — G894 Chronic pain syndrome: Secondary | ICD-10-CM | POA: Diagnosis not present

## 2017-06-10 DIAGNOSIS — Z794 Long term (current) use of insulin: Secondary | ICD-10-CM | POA: Diagnosis not present

## 2017-06-10 DIAGNOSIS — E118 Type 2 diabetes mellitus with unspecified complications: Secondary | ICD-10-CM | POA: Diagnosis not present

## 2017-06-12 DIAGNOSIS — E785 Hyperlipidemia, unspecified: Secondary | ICD-10-CM | POA: Diagnosis not present

## 2017-06-12 DIAGNOSIS — Z0000 Encounter for general adult medical examination without abnormal findings: Secondary | ICD-10-CM | POA: Diagnosis not present

## 2017-06-12 DIAGNOSIS — Z1389 Encounter for screening for other disorder: Secondary | ICD-10-CM | POA: Diagnosis not present

## 2017-06-12 DIAGNOSIS — Z9181 History of falling: Secondary | ICD-10-CM | POA: Diagnosis not present

## 2017-06-19 DIAGNOSIS — M25551 Pain in right hip: Secondary | ICD-10-CM | POA: Diagnosis not present

## 2017-06-19 DIAGNOSIS — M961 Postlaminectomy syndrome, not elsewhere classified: Secondary | ICD-10-CM | POA: Diagnosis not present

## 2017-06-19 DIAGNOSIS — G894 Chronic pain syndrome: Secondary | ICD-10-CM | POA: Diagnosis not present

## 2017-06-19 DIAGNOSIS — Z79891 Long term (current) use of opiate analgesic: Secondary | ICD-10-CM | POA: Diagnosis not present

## 2017-07-13 DIAGNOSIS — E1140 Type 2 diabetes mellitus with diabetic neuropathy, unspecified: Secondary | ICD-10-CM | POA: Diagnosis not present

## 2017-07-13 DIAGNOSIS — E785 Hyperlipidemia, unspecified: Secondary | ICD-10-CM | POA: Diagnosis not present

## 2017-07-13 DIAGNOSIS — K7460 Unspecified cirrhosis of liver: Secondary | ICD-10-CM | POA: Diagnosis not present

## 2017-07-13 DIAGNOSIS — D519 Vitamin B12 deficiency anemia, unspecified: Secondary | ICD-10-CM | POA: Diagnosis not present

## 2017-07-13 DIAGNOSIS — I10 Essential (primary) hypertension: Secondary | ICD-10-CM | POA: Diagnosis not present

## 2017-07-21 DIAGNOSIS — M25551 Pain in right hip: Secondary | ICD-10-CM | POA: Diagnosis not present

## 2017-07-21 DIAGNOSIS — M961 Postlaminectomy syndrome, not elsewhere classified: Secondary | ICD-10-CM | POA: Diagnosis not present

## 2017-07-21 DIAGNOSIS — Z79891 Long term (current) use of opiate analgesic: Secondary | ICD-10-CM | POA: Diagnosis not present

## 2017-07-21 DIAGNOSIS — G894 Chronic pain syndrome: Secondary | ICD-10-CM | POA: Diagnosis not present

## 2017-07-27 DIAGNOSIS — Z23 Encounter for immunization: Secondary | ICD-10-CM | POA: Diagnosis not present

## 2017-08-20 DIAGNOSIS — M961 Postlaminectomy syndrome, not elsewhere classified: Secondary | ICD-10-CM | POA: Diagnosis not present

## 2017-08-20 DIAGNOSIS — G894 Chronic pain syndrome: Secondary | ICD-10-CM | POA: Diagnosis not present

## 2017-08-20 DIAGNOSIS — M25551 Pain in right hip: Secondary | ICD-10-CM | POA: Diagnosis not present

## 2017-08-20 DIAGNOSIS — Z79891 Long term (current) use of opiate analgesic: Secondary | ICD-10-CM | POA: Diagnosis not present

## 2017-09-01 DIAGNOSIS — K7460 Unspecified cirrhosis of liver: Secondary | ICD-10-CM | POA: Diagnosis not present

## 2017-09-01 DIAGNOSIS — D500 Iron deficiency anemia secondary to blood loss (chronic): Secondary | ICD-10-CM | POA: Diagnosis not present

## 2017-09-03 DIAGNOSIS — D500 Iron deficiency anemia secondary to blood loss (chronic): Secondary | ICD-10-CM | POA: Diagnosis not present

## 2017-09-03 DIAGNOSIS — K219 Gastro-esophageal reflux disease without esophagitis: Secondary | ICD-10-CM | POA: Diagnosis not present

## 2017-09-03 DIAGNOSIS — K7460 Unspecified cirrhosis of liver: Secondary | ICD-10-CM | POA: Diagnosis not present

## 2017-09-03 DIAGNOSIS — K2960 Other gastritis without bleeding: Secondary | ICD-10-CM | POA: Diagnosis not present

## 2017-09-18 DIAGNOSIS — Z79891 Long term (current) use of opiate analgesic: Secondary | ICD-10-CM | POA: Diagnosis not present

## 2017-09-18 DIAGNOSIS — M25551 Pain in right hip: Secondary | ICD-10-CM | POA: Diagnosis not present

## 2017-09-18 DIAGNOSIS — G894 Chronic pain syndrome: Secondary | ICD-10-CM | POA: Diagnosis not present

## 2017-09-18 DIAGNOSIS — M961 Postlaminectomy syndrome, not elsewhere classified: Secondary | ICD-10-CM | POA: Diagnosis not present

## 2017-10-21 DIAGNOSIS — M961 Postlaminectomy syndrome, not elsewhere classified: Secondary | ICD-10-CM | POA: Diagnosis not present

## 2017-10-21 DIAGNOSIS — G894 Chronic pain syndrome: Secondary | ICD-10-CM | POA: Diagnosis not present

## 2017-10-21 DIAGNOSIS — Z79891 Long term (current) use of opiate analgesic: Secondary | ICD-10-CM | POA: Diagnosis not present

## 2017-10-21 DIAGNOSIS — M25551 Pain in right hip: Secondary | ICD-10-CM | POA: Diagnosis not present

## 2017-10-27 DIAGNOSIS — H26493 Other secondary cataract, bilateral: Secondary | ICD-10-CM | POA: Diagnosis not present

## 2017-10-27 DIAGNOSIS — E119 Type 2 diabetes mellitus without complications: Secondary | ICD-10-CM | POA: Diagnosis not present

## 2017-11-17 DIAGNOSIS — D519 Vitamin B12 deficiency anemia, unspecified: Secondary | ICD-10-CM | POA: Diagnosis not present

## 2017-11-17 DIAGNOSIS — G894 Chronic pain syndrome: Secondary | ICD-10-CM | POA: Diagnosis not present

## 2017-11-17 DIAGNOSIS — E785 Hyperlipidemia, unspecified: Secondary | ICD-10-CM | POA: Diagnosis not present

## 2017-11-17 DIAGNOSIS — M25551 Pain in right hip: Secondary | ICD-10-CM | POA: Diagnosis not present

## 2017-11-17 DIAGNOSIS — I10 Essential (primary) hypertension: Secondary | ICD-10-CM | POA: Diagnosis not present

## 2017-11-17 DIAGNOSIS — Z79891 Long term (current) use of opiate analgesic: Secondary | ICD-10-CM | POA: Diagnosis not present

## 2017-11-17 DIAGNOSIS — E1140 Type 2 diabetes mellitus with diabetic neuropathy, unspecified: Secondary | ICD-10-CM | POA: Diagnosis not present

## 2017-11-17 DIAGNOSIS — K7460 Unspecified cirrhosis of liver: Secondary | ICD-10-CM | POA: Diagnosis not present

## 2017-11-17 DIAGNOSIS — M961 Postlaminectomy syndrome, not elsewhere classified: Secondary | ICD-10-CM | POA: Diagnosis not present

## 2017-11-23 DIAGNOSIS — Z1231 Encounter for screening mammogram for malignant neoplasm of breast: Secondary | ICD-10-CM | POA: Diagnosis not present

## 2017-12-17 DIAGNOSIS — G894 Chronic pain syndrome: Secondary | ICD-10-CM | POA: Diagnosis not present

## 2017-12-17 DIAGNOSIS — Z79899 Other long term (current) drug therapy: Secondary | ICD-10-CM | POA: Diagnosis not present

## 2017-12-17 DIAGNOSIS — M25551 Pain in right hip: Secondary | ICD-10-CM | POA: Diagnosis not present

## 2017-12-17 DIAGNOSIS — Z79891 Long term (current) use of opiate analgesic: Secondary | ICD-10-CM | POA: Diagnosis not present

## 2017-12-17 DIAGNOSIS — M961 Postlaminectomy syndrome, not elsewhere classified: Secondary | ICD-10-CM | POA: Diagnosis not present

## 2018-01-04 DIAGNOSIS — Z794 Long term (current) use of insulin: Secondary | ICD-10-CM | POA: Diagnosis not present

## 2018-01-04 DIAGNOSIS — E118 Type 2 diabetes mellitus with unspecified complications: Secondary | ICD-10-CM | POA: Diagnosis not present

## 2018-01-12 DIAGNOSIS — Z79899 Other long term (current) drug therapy: Secondary | ICD-10-CM | POA: Diagnosis not present

## 2018-01-14 DIAGNOSIS — M961 Postlaminectomy syndrome, not elsewhere classified: Secondary | ICD-10-CM | POA: Diagnosis not present

## 2018-01-14 DIAGNOSIS — M25551 Pain in right hip: Secondary | ICD-10-CM | POA: Diagnosis not present

## 2018-01-14 DIAGNOSIS — Z79891 Long term (current) use of opiate analgesic: Secondary | ICD-10-CM | POA: Diagnosis not present

## 2018-01-14 DIAGNOSIS — G894 Chronic pain syndrome: Secondary | ICD-10-CM | POA: Diagnosis not present

## 2018-02-09 DIAGNOSIS — Z79899 Other long term (current) drug therapy: Secondary | ICD-10-CM | POA: Diagnosis not present

## 2018-02-12 DIAGNOSIS — M961 Postlaminectomy syndrome, not elsewhere classified: Secondary | ICD-10-CM | POA: Diagnosis not present

## 2018-02-12 DIAGNOSIS — M25551 Pain in right hip: Secondary | ICD-10-CM | POA: Diagnosis not present

## 2018-02-12 DIAGNOSIS — G894 Chronic pain syndrome: Secondary | ICD-10-CM | POA: Diagnosis not present

## 2018-02-12 DIAGNOSIS — Z79891 Long term (current) use of opiate analgesic: Secondary | ICD-10-CM | POA: Diagnosis not present

## 2018-02-24 DIAGNOSIS — R161 Splenomegaly, not elsewhere classified: Secondary | ICD-10-CM | POA: Diagnosis not present

## 2018-02-24 DIAGNOSIS — K7460 Unspecified cirrhosis of liver: Secondary | ICD-10-CM | POA: Diagnosis not present

## 2018-03-09 DIAGNOSIS — Z79899 Other long term (current) drug therapy: Secondary | ICD-10-CM | POA: Diagnosis not present

## 2018-03-18 DIAGNOSIS — D500 Iron deficiency anemia secondary to blood loss (chronic): Secondary | ICD-10-CM | POA: Diagnosis not present

## 2018-03-18 DIAGNOSIS — K7460 Unspecified cirrhosis of liver: Secondary | ICD-10-CM | POA: Diagnosis not present

## 2018-03-19 DIAGNOSIS — Z79891 Long term (current) use of opiate analgesic: Secondary | ICD-10-CM | POA: Diagnosis not present

## 2018-03-19 DIAGNOSIS — M25551 Pain in right hip: Secondary | ICD-10-CM | POA: Diagnosis not present

## 2018-03-19 DIAGNOSIS — G894 Chronic pain syndrome: Secondary | ICD-10-CM | POA: Diagnosis not present

## 2018-03-19 DIAGNOSIS — M961 Postlaminectomy syndrome, not elsewhere classified: Secondary | ICD-10-CM | POA: Diagnosis not present

## 2018-03-23 DIAGNOSIS — D500 Iron deficiency anemia secondary to blood loss (chronic): Secondary | ICD-10-CM | POA: Diagnosis not present

## 2018-03-23 DIAGNOSIS — K2960 Other gastritis without bleeding: Secondary | ICD-10-CM | POA: Diagnosis not present

## 2018-03-23 DIAGNOSIS — K7460 Unspecified cirrhosis of liver: Secondary | ICD-10-CM | POA: Diagnosis not present

## 2018-03-23 DIAGNOSIS — K589 Irritable bowel syndrome without diarrhea: Secondary | ICD-10-CM | POA: Diagnosis not present

## 2018-03-23 DIAGNOSIS — Z1212 Encounter for screening for malignant neoplasm of rectum: Secondary | ICD-10-CM | POA: Diagnosis not present

## 2018-04-12 DIAGNOSIS — L03031 Cellulitis of right toe: Secondary | ICD-10-CM | POA: Diagnosis not present

## 2018-04-12 DIAGNOSIS — Z79899 Other long term (current) drug therapy: Secondary | ICD-10-CM | POA: Diagnosis not present

## 2018-04-13 DIAGNOSIS — Z79891 Long term (current) use of opiate analgesic: Secondary | ICD-10-CM | POA: Diagnosis not present

## 2018-04-13 DIAGNOSIS — M25551 Pain in right hip: Secondary | ICD-10-CM | POA: Diagnosis not present

## 2018-04-13 DIAGNOSIS — G894 Chronic pain syndrome: Secondary | ICD-10-CM | POA: Diagnosis not present

## 2018-04-13 DIAGNOSIS — M961 Postlaminectomy syndrome, not elsewhere classified: Secondary | ICD-10-CM | POA: Diagnosis not present

## 2018-05-10 DIAGNOSIS — E785 Hyperlipidemia, unspecified: Secondary | ICD-10-CM | POA: Diagnosis not present

## 2018-05-10 DIAGNOSIS — D696 Thrombocytopenia, unspecified: Secondary | ICD-10-CM | POA: Diagnosis not present

## 2018-05-10 DIAGNOSIS — D519 Vitamin B12 deficiency anemia, unspecified: Secondary | ICD-10-CM | POA: Diagnosis not present

## 2018-05-10 DIAGNOSIS — E1140 Type 2 diabetes mellitus with diabetic neuropathy, unspecified: Secondary | ICD-10-CM | POA: Diagnosis not present

## 2018-05-10 DIAGNOSIS — I10 Essential (primary) hypertension: Secondary | ICD-10-CM | POA: Diagnosis not present

## 2018-05-25 DIAGNOSIS — Z79891 Long term (current) use of opiate analgesic: Secondary | ICD-10-CM | POA: Diagnosis not present

## 2018-05-25 DIAGNOSIS — G894 Chronic pain syndrome: Secondary | ICD-10-CM | POA: Diagnosis not present

## 2018-06-10 DIAGNOSIS — Z79899 Other long term (current) drug therapy: Secondary | ICD-10-CM | POA: Diagnosis not present

## 2018-06-18 DIAGNOSIS — Z1211 Encounter for screening for malignant neoplasm of colon: Secondary | ICD-10-CM | POA: Diagnosis not present

## 2018-06-18 DIAGNOSIS — Z1231 Encounter for screening mammogram for malignant neoplasm of breast: Secondary | ICD-10-CM | POA: Diagnosis not present

## 2018-06-18 DIAGNOSIS — E785 Hyperlipidemia, unspecified: Secondary | ICD-10-CM | POA: Diagnosis not present

## 2018-06-18 DIAGNOSIS — Z9181 History of falling: Secondary | ICD-10-CM | POA: Diagnosis not present

## 2018-06-18 DIAGNOSIS — Z0000 Encounter for general adult medical examination without abnormal findings: Secondary | ICD-10-CM | POA: Diagnosis not present

## 2018-06-24 DIAGNOSIS — G894 Chronic pain syndrome: Secondary | ICD-10-CM | POA: Diagnosis not present

## 2018-07-13 DIAGNOSIS — Z9181 History of falling: Secondary | ICD-10-CM | POA: Diagnosis not present

## 2018-07-21 DIAGNOSIS — G894 Chronic pain syndrome: Secondary | ICD-10-CM | POA: Diagnosis not present

## 2018-07-22 DIAGNOSIS — R2681 Unsteadiness on feet: Secondary | ICD-10-CM | POA: Diagnosis not present

## 2018-07-30 DIAGNOSIS — Z23 Encounter for immunization: Secondary | ICD-10-CM | POA: Diagnosis not present

## 2018-08-12 DIAGNOSIS — K7460 Unspecified cirrhosis of liver: Secondary | ICD-10-CM | POA: Diagnosis not present

## 2018-08-12 DIAGNOSIS — D696 Thrombocytopenia, unspecified: Secondary | ICD-10-CM | POA: Diagnosis not present

## 2018-08-12 DIAGNOSIS — E1140 Type 2 diabetes mellitus with diabetic neuropathy, unspecified: Secondary | ICD-10-CM | POA: Diagnosis not present

## 2018-08-12 DIAGNOSIS — I10 Essential (primary) hypertension: Secondary | ICD-10-CM | POA: Diagnosis not present

## 2018-08-12 DIAGNOSIS — E785 Hyperlipidemia, unspecified: Secondary | ICD-10-CM | POA: Diagnosis not present

## 2018-08-20 DIAGNOSIS — G894 Chronic pain syndrome: Secondary | ICD-10-CM | POA: Diagnosis not present

## 2018-09-01 DIAGNOSIS — K5090 Crohn's disease, unspecified, without complications: Secondary | ICD-10-CM | POA: Diagnosis not present

## 2018-09-01 DIAGNOSIS — K5080 Crohn's disease of both small and large intestine without complications: Secondary | ICD-10-CM | POA: Diagnosis not present

## 2018-09-01 DIAGNOSIS — K635 Polyp of colon: Secondary | ICD-10-CM | POA: Diagnosis not present

## 2018-09-01 DIAGNOSIS — D122 Benign neoplasm of ascending colon: Secondary | ICD-10-CM | POA: Diagnosis not present

## 2018-09-01 DIAGNOSIS — Z1211 Encounter for screening for malignant neoplasm of colon: Secondary | ICD-10-CM | POA: Diagnosis not present

## 2018-09-13 DIAGNOSIS — Z79899 Other long term (current) drug therapy: Secondary | ICD-10-CM | POA: Diagnosis not present

## 2018-09-20 DIAGNOSIS — G894 Chronic pain syndrome: Secondary | ICD-10-CM | POA: Diagnosis not present

## 2018-09-21 DIAGNOSIS — K7460 Unspecified cirrhosis of liver: Secondary | ICD-10-CM | POA: Diagnosis not present

## 2018-09-21 DIAGNOSIS — K7689 Other specified diseases of liver: Secondary | ICD-10-CM | POA: Diagnosis not present

## 2018-09-24 DIAGNOSIS — D500 Iron deficiency anemia secondary to blood loss (chronic): Secondary | ICD-10-CM | POA: Diagnosis not present

## 2018-09-24 DIAGNOSIS — K7460 Unspecified cirrhosis of liver: Secondary | ICD-10-CM | POA: Diagnosis not present

## 2018-09-30 DIAGNOSIS — K2960 Other gastritis without bleeding: Secondary | ICD-10-CM | POA: Diagnosis not present

## 2018-09-30 DIAGNOSIS — D500 Iron deficiency anemia secondary to blood loss (chronic): Secondary | ICD-10-CM | POA: Diagnosis not present

## 2018-09-30 DIAGNOSIS — K7460 Unspecified cirrhosis of liver: Secondary | ICD-10-CM | POA: Diagnosis not present

## 2018-09-30 DIAGNOSIS — K5090 Crohn's disease, unspecified, without complications: Secondary | ICD-10-CM | POA: Diagnosis not present

## 2018-09-30 DIAGNOSIS — K219 Gastro-esophageal reflux disease without esophagitis: Secondary | ICD-10-CM | POA: Diagnosis not present

## 2018-10-05 DIAGNOSIS — R197 Diarrhea, unspecified: Secondary | ICD-10-CM | POA: Diagnosis not present

## 2018-10-11 DIAGNOSIS — Z79899 Other long term (current) drug therapy: Secondary | ICD-10-CM | POA: Diagnosis not present

## 2018-11-01 DIAGNOSIS — Z79891 Long term (current) use of opiate analgesic: Secondary | ICD-10-CM | POA: Diagnosis not present

## 2018-11-01 DIAGNOSIS — M961 Postlaminectomy syndrome, not elsewhere classified: Secondary | ICD-10-CM | POA: Diagnosis not present

## 2018-11-01 DIAGNOSIS — G894 Chronic pain syndrome: Secondary | ICD-10-CM | POA: Diagnosis not present

## 2018-11-01 DIAGNOSIS — E1142 Type 2 diabetes mellitus with diabetic polyneuropathy: Secondary | ICD-10-CM | POA: Diagnosis not present

## 2018-11-15 DIAGNOSIS — Z79899 Other long term (current) drug therapy: Secondary | ICD-10-CM | POA: Diagnosis not present

## 2018-11-16 DIAGNOSIS — Z794 Long term (current) use of insulin: Secondary | ICD-10-CM | POA: Diagnosis not present

## 2018-11-16 DIAGNOSIS — E118 Type 2 diabetes mellitus with unspecified complications: Secondary | ICD-10-CM | POA: Diagnosis not present

## 2018-11-16 DIAGNOSIS — E1142 Type 2 diabetes mellitus with diabetic polyneuropathy: Secondary | ICD-10-CM | POA: Diagnosis not present

## 2018-11-16 DIAGNOSIS — R809 Proteinuria, unspecified: Secondary | ICD-10-CM | POA: Diagnosis not present

## 2018-11-30 DIAGNOSIS — E1142 Type 2 diabetes mellitus with diabetic polyneuropathy: Secondary | ICD-10-CM | POA: Diagnosis not present

## 2018-11-30 DIAGNOSIS — G894 Chronic pain syndrome: Secondary | ICD-10-CM | POA: Diagnosis not present

## 2018-11-30 DIAGNOSIS — Z79891 Long term (current) use of opiate analgesic: Secondary | ICD-10-CM | POA: Diagnosis not present

## 2018-11-30 DIAGNOSIS — M961 Postlaminectomy syndrome, not elsewhere classified: Secondary | ICD-10-CM | POA: Diagnosis not present

## 2018-12-16 DIAGNOSIS — Z79899 Other long term (current) drug therapy: Secondary | ICD-10-CM | POA: Diagnosis not present

## 2018-12-28 DIAGNOSIS — M961 Postlaminectomy syndrome, not elsewhere classified: Secondary | ICD-10-CM | POA: Diagnosis not present

## 2018-12-28 DIAGNOSIS — G894 Chronic pain syndrome: Secondary | ICD-10-CM | POA: Diagnosis not present

## 2018-12-28 DIAGNOSIS — Z79891 Long term (current) use of opiate analgesic: Secondary | ICD-10-CM | POA: Diagnosis not present

## 2018-12-28 DIAGNOSIS — E1142 Type 2 diabetes mellitus with diabetic polyneuropathy: Secondary | ICD-10-CM | POA: Diagnosis not present

## 2019-01-25 DIAGNOSIS — E1142 Type 2 diabetes mellitus with diabetic polyneuropathy: Secondary | ICD-10-CM | POA: Diagnosis not present

## 2019-01-25 DIAGNOSIS — M961 Postlaminectomy syndrome, not elsewhere classified: Secondary | ICD-10-CM | POA: Diagnosis not present

## 2019-01-25 DIAGNOSIS — G894 Chronic pain syndrome: Secondary | ICD-10-CM | POA: Diagnosis not present

## 2019-01-25 DIAGNOSIS — Z79891 Long term (current) use of opiate analgesic: Secondary | ICD-10-CM | POA: Diagnosis not present

## 2019-02-14 DIAGNOSIS — I10 Essential (primary) hypertension: Secondary | ICD-10-CM | POA: Diagnosis not present

## 2019-02-14 DIAGNOSIS — E1140 Type 2 diabetes mellitus with diabetic neuropathy, unspecified: Secondary | ICD-10-CM | POA: Diagnosis not present

## 2019-02-14 DIAGNOSIS — D696 Thrombocytopenia, unspecified: Secondary | ICD-10-CM | POA: Diagnosis not present

## 2019-02-23 DIAGNOSIS — M961 Postlaminectomy syndrome, not elsewhere classified: Secondary | ICD-10-CM | POA: Diagnosis not present

## 2019-02-23 DIAGNOSIS — G894 Chronic pain syndrome: Secondary | ICD-10-CM | POA: Diagnosis not present

## 2019-02-23 DIAGNOSIS — E1142 Type 2 diabetes mellitus with diabetic polyneuropathy: Secondary | ICD-10-CM | POA: Diagnosis not present

## 2019-02-23 DIAGNOSIS — Z79891 Long term (current) use of opiate analgesic: Secondary | ICD-10-CM | POA: Diagnosis not present

## 2019-03-15 DIAGNOSIS — Z79899 Other long term (current) drug therapy: Secondary | ICD-10-CM | POA: Diagnosis not present

## 2019-03-24 DIAGNOSIS — M961 Postlaminectomy syndrome, not elsewhere classified: Secondary | ICD-10-CM | POA: Diagnosis not present

## 2019-03-24 DIAGNOSIS — G894 Chronic pain syndrome: Secondary | ICD-10-CM | POA: Diagnosis not present

## 2019-03-24 DIAGNOSIS — E1142 Type 2 diabetes mellitus with diabetic polyneuropathy: Secondary | ICD-10-CM | POA: Diagnosis not present

## 2019-03-24 DIAGNOSIS — Z79891 Long term (current) use of opiate analgesic: Secondary | ICD-10-CM | POA: Diagnosis not present

## 2019-03-29 DIAGNOSIS — K7460 Unspecified cirrhosis of liver: Secondary | ICD-10-CM | POA: Diagnosis not present

## 2019-04-02 DIAGNOSIS — K7460 Unspecified cirrhosis of liver: Secondary | ICD-10-CM | POA: Diagnosis not present

## 2019-04-06 DIAGNOSIS — D500 Iron deficiency anemia secondary to blood loss (chronic): Secondary | ICD-10-CM | POA: Diagnosis not present

## 2019-04-07 DIAGNOSIS — D500 Iron deficiency anemia secondary to blood loss (chronic): Secondary | ICD-10-CM | POA: Diagnosis not present

## 2019-04-07 DIAGNOSIS — K7460 Unspecified cirrhosis of liver: Secondary | ICD-10-CM | POA: Diagnosis not present

## 2019-04-07 DIAGNOSIS — K219 Gastro-esophageal reflux disease without esophagitis: Secondary | ICD-10-CM | POA: Diagnosis not present

## 2019-04-07 DIAGNOSIS — K589 Irritable bowel syndrome without diarrhea: Secondary | ICD-10-CM | POA: Diagnosis not present

## 2019-04-07 DIAGNOSIS — K2960 Other gastritis without bleeding: Secondary | ICD-10-CM | POA: Diagnosis not present

## 2019-04-14 DIAGNOSIS — Z79899 Other long term (current) drug therapy: Secondary | ICD-10-CM | POA: Diagnosis not present

## 2019-04-21 DIAGNOSIS — G894 Chronic pain syndrome: Secondary | ICD-10-CM | POA: Diagnosis not present

## 2019-04-21 DIAGNOSIS — Z79891 Long term (current) use of opiate analgesic: Secondary | ICD-10-CM | POA: Diagnosis not present

## 2019-04-21 DIAGNOSIS — E1142 Type 2 diabetes mellitus with diabetic polyneuropathy: Secondary | ICD-10-CM | POA: Diagnosis not present

## 2019-04-21 DIAGNOSIS — M961 Postlaminectomy syndrome, not elsewhere classified: Secondary | ICD-10-CM | POA: Diagnosis not present

## 2019-05-12 DIAGNOSIS — Z79899 Other long term (current) drug therapy: Secondary | ICD-10-CM | POA: Diagnosis not present

## 2019-05-20 DIAGNOSIS — M961 Postlaminectomy syndrome, not elsewhere classified: Secondary | ICD-10-CM | POA: Diagnosis not present

## 2019-05-20 DIAGNOSIS — G894 Chronic pain syndrome: Secondary | ICD-10-CM | POA: Diagnosis not present

## 2019-05-20 DIAGNOSIS — E1142 Type 2 diabetes mellitus with diabetic polyneuropathy: Secondary | ICD-10-CM | POA: Diagnosis not present

## 2019-05-20 DIAGNOSIS — Z79891 Long term (current) use of opiate analgesic: Secondary | ICD-10-CM | POA: Diagnosis not present

## 2019-06-13 DIAGNOSIS — Z79899 Other long term (current) drug therapy: Secondary | ICD-10-CM | POA: Diagnosis not present

## 2019-06-17 DIAGNOSIS — Z79891 Long term (current) use of opiate analgesic: Secondary | ICD-10-CM | POA: Diagnosis not present

## 2019-06-17 DIAGNOSIS — E1142 Type 2 diabetes mellitus with diabetic polyneuropathy: Secondary | ICD-10-CM | POA: Diagnosis not present

## 2019-06-17 DIAGNOSIS — M961 Postlaminectomy syndrome, not elsewhere classified: Secondary | ICD-10-CM | POA: Diagnosis not present

## 2019-06-17 DIAGNOSIS — G894 Chronic pain syndrome: Secondary | ICD-10-CM | POA: Diagnosis not present

## 2019-06-28 DIAGNOSIS — Z9181 History of falling: Secondary | ICD-10-CM | POA: Diagnosis not present

## 2019-06-28 DIAGNOSIS — E785 Hyperlipidemia, unspecified: Secondary | ICD-10-CM | POA: Diagnosis not present

## 2019-06-28 DIAGNOSIS — Z0000 Encounter for general adult medical examination without abnormal findings: Secondary | ICD-10-CM | POA: Diagnosis not present

## 2019-07-08 DIAGNOSIS — Z1231 Encounter for screening mammogram for malignant neoplasm of breast: Secondary | ICD-10-CM | POA: Diagnosis not present

## 2019-07-13 DIAGNOSIS — R809 Proteinuria, unspecified: Secondary | ICD-10-CM | POA: Diagnosis not present

## 2019-07-13 DIAGNOSIS — Z794 Long term (current) use of insulin: Secondary | ICD-10-CM | POA: Diagnosis not present

## 2019-07-13 DIAGNOSIS — E1142 Type 2 diabetes mellitus with diabetic polyneuropathy: Secondary | ICD-10-CM | POA: Diagnosis not present

## 2019-07-15 DIAGNOSIS — Z79891 Long term (current) use of opiate analgesic: Secondary | ICD-10-CM | POA: Diagnosis not present

## 2019-07-15 DIAGNOSIS — E1142 Type 2 diabetes mellitus with diabetic polyneuropathy: Secondary | ICD-10-CM | POA: Diagnosis not present

## 2019-07-15 DIAGNOSIS — M961 Postlaminectomy syndrome, not elsewhere classified: Secondary | ICD-10-CM | POA: Diagnosis not present

## 2019-07-15 DIAGNOSIS — G894 Chronic pain syndrome: Secondary | ICD-10-CM | POA: Diagnosis not present

## 2019-07-20 DIAGNOSIS — E785 Hyperlipidemia, unspecified: Secondary | ICD-10-CM | POA: Diagnosis not present

## 2019-07-20 DIAGNOSIS — E1140 Type 2 diabetes mellitus with diabetic neuropathy, unspecified: Secondary | ICD-10-CM | POA: Diagnosis not present

## 2019-07-20 DIAGNOSIS — D519 Vitamin B12 deficiency anemia, unspecified: Secondary | ICD-10-CM | POA: Diagnosis not present

## 2019-07-20 DIAGNOSIS — E1165 Type 2 diabetes mellitus with hyperglycemia: Secondary | ICD-10-CM | POA: Diagnosis not present

## 2019-07-27 DIAGNOSIS — H04123 Dry eye syndrome of bilateral lacrimal glands: Secondary | ICD-10-CM | POA: Diagnosis not present

## 2019-07-27 DIAGNOSIS — Z794 Long term (current) use of insulin: Secondary | ICD-10-CM | POA: Diagnosis not present

## 2019-07-27 DIAGNOSIS — E119 Type 2 diabetes mellitus without complications: Secondary | ICD-10-CM | POA: Diagnosis not present

## 2019-07-27 DIAGNOSIS — Z961 Presence of intraocular lens: Secondary | ICD-10-CM | POA: Diagnosis not present

## 2019-07-27 DIAGNOSIS — Z23 Encounter for immunization: Secondary | ICD-10-CM | POA: Diagnosis not present

## 2019-08-12 DIAGNOSIS — G894 Chronic pain syndrome: Secondary | ICD-10-CM | POA: Diagnosis not present

## 2019-08-12 DIAGNOSIS — M961 Postlaminectomy syndrome, not elsewhere classified: Secondary | ICD-10-CM | POA: Diagnosis not present

## 2019-08-12 DIAGNOSIS — E1142 Type 2 diabetes mellitus with diabetic polyneuropathy: Secondary | ICD-10-CM | POA: Diagnosis not present

## 2019-08-12 DIAGNOSIS — Z79891 Long term (current) use of opiate analgesic: Secondary | ICD-10-CM | POA: Diagnosis not present

## 2019-08-17 DIAGNOSIS — Z79899 Other long term (current) drug therapy: Secondary | ICD-10-CM | POA: Diagnosis not present

## 2019-09-09 DIAGNOSIS — Z79891 Long term (current) use of opiate analgesic: Secondary | ICD-10-CM | POA: Diagnosis not present

## 2019-09-09 DIAGNOSIS — G894 Chronic pain syndrome: Secondary | ICD-10-CM | POA: Diagnosis not present

## 2019-09-09 DIAGNOSIS — M961 Postlaminectomy syndrome, not elsewhere classified: Secondary | ICD-10-CM | POA: Diagnosis not present

## 2019-09-09 DIAGNOSIS — E1142 Type 2 diabetes mellitus with diabetic polyneuropathy: Secondary | ICD-10-CM | POA: Diagnosis not present

## 2019-10-07 DIAGNOSIS — G894 Chronic pain syndrome: Secondary | ICD-10-CM | POA: Diagnosis not present

## 2019-10-07 DIAGNOSIS — M961 Postlaminectomy syndrome, not elsewhere classified: Secondary | ICD-10-CM | POA: Diagnosis not present

## 2019-10-07 DIAGNOSIS — Z79891 Long term (current) use of opiate analgesic: Secondary | ICD-10-CM | POA: Diagnosis not present

## 2019-10-07 DIAGNOSIS — E1142 Type 2 diabetes mellitus with diabetic polyneuropathy: Secondary | ICD-10-CM | POA: Diagnosis not present

## 2019-10-31 DIAGNOSIS — Z79899 Other long term (current) drug therapy: Secondary | ICD-10-CM | POA: Diagnosis not present

## 2019-11-04 DIAGNOSIS — G894 Chronic pain syndrome: Secondary | ICD-10-CM | POA: Diagnosis not present

## 2019-11-04 DIAGNOSIS — M961 Postlaminectomy syndrome, not elsewhere classified: Secondary | ICD-10-CM | POA: Diagnosis not present

## 2019-11-04 DIAGNOSIS — E1142 Type 2 diabetes mellitus with diabetic polyneuropathy: Secondary | ICD-10-CM | POA: Diagnosis not present

## 2019-11-04 DIAGNOSIS — Z79891 Long term (current) use of opiate analgesic: Secondary | ICD-10-CM | POA: Diagnosis not present

## 2019-11-29 DIAGNOSIS — K7460 Unspecified cirrhosis of liver: Secondary | ICD-10-CM | POA: Diagnosis not present

## 2019-11-29 DIAGNOSIS — R1013 Epigastric pain: Secondary | ICD-10-CM | POA: Diagnosis not present

## 2019-11-29 DIAGNOSIS — D500 Iron deficiency anemia secondary to blood loss (chronic): Secondary | ICD-10-CM | POA: Diagnosis not present

## 2019-12-02 DIAGNOSIS — Z79891 Long term (current) use of opiate analgesic: Secondary | ICD-10-CM | POA: Diagnosis not present

## 2019-12-02 DIAGNOSIS — G894 Chronic pain syndrome: Secondary | ICD-10-CM | POA: Diagnosis not present

## 2019-12-02 DIAGNOSIS — M961 Postlaminectomy syndrome, not elsewhere classified: Secondary | ICD-10-CM | POA: Diagnosis not present

## 2019-12-02 DIAGNOSIS — E1142 Type 2 diabetes mellitus with diabetic polyneuropathy: Secondary | ICD-10-CM | POA: Diagnosis not present

## 2019-12-24 DIAGNOSIS — G8929 Other chronic pain: Secondary | ICD-10-CM | POA: Diagnosis not present

## 2019-12-24 DIAGNOSIS — R531 Weakness: Secondary | ICD-10-CM | POA: Diagnosis not present

## 2019-12-24 DIAGNOSIS — Z743 Need for continuous supervision: Secondary | ICD-10-CM | POA: Diagnosis not present

## 2019-12-24 DIAGNOSIS — E860 Dehydration: Secondary | ICD-10-CM | POA: Diagnosis not present

## 2019-12-24 DIAGNOSIS — M545 Low back pain: Secondary | ICD-10-CM | POA: Diagnosis not present

## 2019-12-24 DIAGNOSIS — W19XXXA Unspecified fall, initial encounter: Secondary | ICD-10-CM | POA: Diagnosis not present

## 2019-12-24 DIAGNOSIS — I10 Essential (primary) hypertension: Secondary | ICD-10-CM | POA: Diagnosis not present

## 2019-12-24 DIAGNOSIS — E1165 Type 2 diabetes mellitus with hyperglycemia: Secondary | ICD-10-CM | POA: Diagnosis not present

## 2019-12-24 DIAGNOSIS — R52 Pain, unspecified: Secondary | ICD-10-CM | POA: Diagnosis not present

## 2019-12-25 DIAGNOSIS — S3992XA Unspecified injury of lower back, initial encounter: Secondary | ICD-10-CM | POA: Diagnosis not present

## 2019-12-25 DIAGNOSIS — R739 Hyperglycemia, unspecified: Secondary | ICD-10-CM

## 2019-12-25 DIAGNOSIS — R262 Difficulty in walking, not elsewhere classified: Secondary | ICD-10-CM

## 2019-12-25 DIAGNOSIS — S79911A Unspecified injury of right hip, initial encounter: Secondary | ICD-10-CM | POA: Diagnosis not present

## 2019-12-25 DIAGNOSIS — E1140 Type 2 diabetes mellitus with diabetic neuropathy, unspecified: Secondary | ICD-10-CM

## 2019-12-25 DIAGNOSIS — D696 Thrombocytopenia, unspecified: Secondary | ICD-10-CM

## 2019-12-25 DIAGNOSIS — M545 Low back pain: Secondary | ICD-10-CM

## 2019-12-25 DIAGNOSIS — L89320 Pressure ulcer of left buttock, unstageable: Secondary | ICD-10-CM | POA: Diagnosis not present

## 2019-12-25 DIAGNOSIS — L98411 Non-pressure chronic ulcer of buttock limited to breakdown of skin: Secondary | ICD-10-CM

## 2019-12-25 DIAGNOSIS — R531 Weakness: Secondary | ICD-10-CM

## 2019-12-25 DIAGNOSIS — S79912A Unspecified injury of left hip, initial encounter: Secondary | ICD-10-CM | POA: Diagnosis not present

## 2019-12-26 DIAGNOSIS — D696 Thrombocytopenia, unspecified: Secondary | ICD-10-CM | POA: Diagnosis not present

## 2019-12-26 DIAGNOSIS — M545 Low back pain: Secondary | ICD-10-CM | POA: Diagnosis not present

## 2019-12-26 DIAGNOSIS — Z7401 Bed confinement status: Secondary | ICD-10-CM | POA: Diagnosis not present

## 2019-12-26 DIAGNOSIS — E7849 Other hyperlipidemia: Secondary | ICD-10-CM | POA: Diagnosis not present

## 2019-12-26 DIAGNOSIS — E785 Hyperlipidemia, unspecified: Secondary | ICD-10-CM | POA: Diagnosis not present

## 2019-12-26 DIAGNOSIS — M6281 Muscle weakness (generalized): Secondary | ICD-10-CM | POA: Diagnosis not present

## 2019-12-26 DIAGNOSIS — E876 Hypokalemia: Secondary | ICD-10-CM | POA: Diagnosis not present

## 2019-12-26 DIAGNOSIS — E1165 Type 2 diabetes mellitus with hyperglycemia: Secondary | ICD-10-CM | POA: Diagnosis not present

## 2019-12-26 DIAGNOSIS — Z743 Need for continuous supervision: Secondary | ICD-10-CM | POA: Diagnosis not present

## 2019-12-26 DIAGNOSIS — S3992XA Unspecified injury of lower back, initial encounter: Secondary | ICD-10-CM | POA: Diagnosis not present

## 2019-12-26 DIAGNOSIS — E1140 Type 2 diabetes mellitus with diabetic neuropathy, unspecified: Secondary | ICD-10-CM | POA: Diagnosis not present

## 2019-12-26 DIAGNOSIS — Z794 Long term (current) use of insulin: Secondary | ICD-10-CM | POA: Diagnosis not present

## 2019-12-26 DIAGNOSIS — R531 Weakness: Secondary | ICD-10-CM | POA: Diagnosis not present

## 2019-12-26 DIAGNOSIS — Z9181 History of falling: Secondary | ICD-10-CM | POA: Diagnosis not present

## 2019-12-26 DIAGNOSIS — K5080 Crohn's disease of both small and large intestine without complications: Secondary | ICD-10-CM | POA: Diagnosis not present

## 2019-12-26 DIAGNOSIS — D62 Acute posthemorrhagic anemia: Secondary | ICD-10-CM | POA: Diagnosis not present

## 2019-12-26 DIAGNOSIS — R262 Difficulty in walking, not elsewhere classified: Secondary | ICD-10-CM | POA: Diagnosis not present

## 2019-12-26 DIAGNOSIS — S31000D Unspecified open wound of lower back and pelvis without penetration into retroperitoneum, subsequent encounter: Secondary | ICD-10-CM | POA: Diagnosis not present

## 2019-12-26 DIAGNOSIS — L89150 Pressure ulcer of sacral region, unstageable: Secondary | ICD-10-CM | POA: Diagnosis not present

## 2019-12-26 DIAGNOSIS — E119 Type 2 diabetes mellitus without complications: Secondary | ICD-10-CM | POA: Diagnosis not present

## 2019-12-26 DIAGNOSIS — Z87891 Personal history of nicotine dependence: Secondary | ICD-10-CM | POA: Diagnosis not present

## 2019-12-26 DIAGNOSIS — I96 Gangrene, not elsewhere classified: Secondary | ICD-10-CM | POA: Diagnosis not present

## 2019-12-26 DIAGNOSIS — W19XXXA Unspecified fall, initial encounter: Secondary | ICD-10-CM | POA: Diagnosis not present

## 2019-12-26 DIAGNOSIS — N179 Acute kidney failure, unspecified: Secondary | ICD-10-CM | POA: Diagnosis not present

## 2019-12-26 DIAGNOSIS — M1990 Unspecified osteoarthritis, unspecified site: Secondary | ICD-10-CM | POA: Diagnosis not present

## 2019-12-26 DIAGNOSIS — S79911A Unspecified injury of right hip, initial encounter: Secondary | ICD-10-CM | POA: Diagnosis not present

## 2019-12-26 DIAGNOSIS — M150 Primary generalized (osteo)arthritis: Secondary | ICD-10-CM | POA: Diagnosis not present

## 2019-12-26 DIAGNOSIS — Z8719 Personal history of other diseases of the digestive system: Secondary | ICD-10-CM | POA: Diagnosis not present

## 2019-12-26 DIAGNOSIS — E1169 Type 2 diabetes mellitus with other specified complication: Secondary | ICD-10-CM | POA: Diagnosis not present

## 2019-12-26 DIAGNOSIS — S79912A Unspecified injury of left hip, initial encounter: Secondary | ICD-10-CM | POA: Diagnosis not present

## 2019-12-26 DIAGNOSIS — I110 Hypertensive heart disease with heart failure: Secondary | ICD-10-CM | POA: Diagnosis not present

## 2019-12-26 DIAGNOSIS — M2550 Pain in unspecified joint: Secondary | ICD-10-CM | POA: Diagnosis not present

## 2019-12-26 DIAGNOSIS — E860 Dehydration: Secondary | ICD-10-CM | POA: Diagnosis not present

## 2019-12-26 DIAGNOSIS — M5136 Other intervertebral disc degeneration, lumbar region: Secondary | ICD-10-CM | POA: Diagnosis not present

## 2019-12-26 DIAGNOSIS — G894 Chronic pain syndrome: Secondary | ICD-10-CM | POA: Diagnosis not present

## 2019-12-26 DIAGNOSIS — K219 Gastro-esophageal reflux disease without esophagitis: Secondary | ICD-10-CM | POA: Diagnosis not present

## 2019-12-26 DIAGNOSIS — K7460 Unspecified cirrhosis of liver: Secondary | ICD-10-CM | POA: Diagnosis not present

## 2019-12-26 DIAGNOSIS — R279 Unspecified lack of coordination: Secondary | ICD-10-CM | POA: Diagnosis not present

## 2019-12-26 DIAGNOSIS — I10 Essential (primary) hypertension: Secondary | ICD-10-CM | POA: Diagnosis not present

## 2019-12-26 DIAGNOSIS — E1152 Type 2 diabetes mellitus with diabetic peripheral angiopathy with gangrene: Secondary | ICD-10-CM | POA: Diagnosis not present

## 2019-12-26 DIAGNOSIS — D649 Anemia, unspecified: Secondary | ICD-10-CM | POA: Diagnosis not present

## 2019-12-26 DIAGNOSIS — I509 Heart failure, unspecified: Secondary | ICD-10-CM | POA: Diagnosis not present

## 2019-12-26 DIAGNOSIS — Z79891 Long term (current) use of opiate analgesic: Secondary | ICD-10-CM | POA: Diagnosis not present

## 2019-12-26 DIAGNOSIS — E7800 Pure hypercholesterolemia, unspecified: Secondary | ICD-10-CM | POA: Diagnosis not present

## 2019-12-26 DIAGNOSIS — L89329 Pressure ulcer of left buttock, unspecified stage: Secondary | ICD-10-CM | POA: Diagnosis not present

## 2019-12-26 DIAGNOSIS — R296 Repeated falls: Secondary | ICD-10-CM | POA: Diagnosis not present

## 2019-12-26 DIAGNOSIS — L89320 Pressure ulcer of left buttock, unstageable: Secondary | ICD-10-CM | POA: Diagnosis not present

## 2020-01-02 DIAGNOSIS — D649 Anemia, unspecified: Secondary | ICD-10-CM | POA: Diagnosis not present

## 2020-01-02 DIAGNOSIS — M6281 Muscle weakness (generalized): Secondary | ICD-10-CM | POA: Diagnosis not present

## 2020-01-02 DIAGNOSIS — Z7401 Bed confinement status: Secondary | ICD-10-CM | POA: Diagnosis not present

## 2020-01-02 DIAGNOSIS — M150 Primary generalized (osteo)arthritis: Secondary | ICD-10-CM | POA: Diagnosis not present

## 2020-01-02 DIAGNOSIS — M545 Low back pain: Secondary | ICD-10-CM | POA: Diagnosis not present

## 2020-01-02 DIAGNOSIS — W19XXXA Unspecified fall, initial encounter: Secondary | ICD-10-CM | POA: Diagnosis not present

## 2020-01-02 DIAGNOSIS — E1140 Type 2 diabetes mellitus with diabetic neuropathy, unspecified: Secondary | ICD-10-CM | POA: Diagnosis not present

## 2020-01-02 DIAGNOSIS — Z9181 History of falling: Secondary | ICD-10-CM | POA: Diagnosis not present

## 2020-01-02 DIAGNOSIS — E7849 Other hyperlipidemia: Secondary | ICD-10-CM | POA: Diagnosis not present

## 2020-01-02 DIAGNOSIS — K219 Gastro-esophageal reflux disease without esophagitis: Secondary | ICD-10-CM | POA: Diagnosis not present

## 2020-01-02 DIAGNOSIS — M5136 Other intervertebral disc degeneration, lumbar region: Secondary | ICD-10-CM | POA: Diagnosis not present

## 2020-01-02 DIAGNOSIS — D696 Thrombocytopenia, unspecified: Secondary | ICD-10-CM | POA: Diagnosis not present

## 2020-01-02 DIAGNOSIS — K5080 Crohn's disease of both small and large intestine without complications: Secondary | ICD-10-CM | POA: Diagnosis not present

## 2020-01-02 DIAGNOSIS — G8929 Other chronic pain: Secondary | ICD-10-CM | POA: Diagnosis not present

## 2020-01-02 DIAGNOSIS — L89150 Pressure ulcer of sacral region, unstageable: Secondary | ICD-10-CM | POA: Diagnosis not present

## 2020-01-02 DIAGNOSIS — R279 Unspecified lack of coordination: Secondary | ICD-10-CM | POA: Diagnosis not present

## 2020-01-02 DIAGNOSIS — S31000D Unspecified open wound of lower back and pelvis without penetration into retroperitoneum, subsequent encounter: Secondary | ICD-10-CM | POA: Diagnosis not present

## 2020-01-02 DIAGNOSIS — R531 Weakness: Secondary | ICD-10-CM | POA: Diagnosis not present

## 2020-01-02 DIAGNOSIS — E1165 Type 2 diabetes mellitus with hyperglycemia: Secondary | ICD-10-CM | POA: Diagnosis not present

## 2020-01-02 DIAGNOSIS — I110 Hypertensive heart disease with heart failure: Secondary | ICD-10-CM | POA: Diagnosis not present

## 2020-01-02 DIAGNOSIS — N179 Acute kidney failure, unspecified: Secondary | ICD-10-CM | POA: Diagnosis not present

## 2020-01-02 DIAGNOSIS — E1169 Type 2 diabetes mellitus with other specified complication: Secondary | ICD-10-CM | POA: Diagnosis not present

## 2020-01-02 DIAGNOSIS — Z743 Need for continuous supervision: Secondary | ICD-10-CM | POA: Diagnosis not present

## 2020-01-02 DIAGNOSIS — I509 Heart failure, unspecified: Secondary | ICD-10-CM | POA: Diagnosis not present

## 2020-01-02 DIAGNOSIS — I10 Essential (primary) hypertension: Secondary | ICD-10-CM | POA: Diagnosis not present

## 2020-01-02 DIAGNOSIS — R262 Difficulty in walking, not elsewhere classified: Secondary | ICD-10-CM | POA: Diagnosis not present

## 2020-01-02 DIAGNOSIS — S31829D Unspecified open wound of left buttock, subsequent encounter: Secondary | ICD-10-CM | POA: Diagnosis not present

## 2020-01-02 DIAGNOSIS — M2550 Pain in unspecified joint: Secondary | ICD-10-CM | POA: Diagnosis not present

## 2020-01-02 DIAGNOSIS — G894 Chronic pain syndrome: Secondary | ICD-10-CM | POA: Diagnosis not present

## 2020-01-03 DIAGNOSIS — R262 Difficulty in walking, not elsewhere classified: Secondary | ICD-10-CM | POA: Diagnosis not present

## 2020-01-03 DIAGNOSIS — M545 Low back pain: Secondary | ICD-10-CM | POA: Diagnosis not present

## 2020-01-03 DIAGNOSIS — E1165 Type 2 diabetes mellitus with hyperglycemia: Secondary | ICD-10-CM | POA: Diagnosis not present

## 2020-01-03 DIAGNOSIS — G8929 Other chronic pain: Secondary | ICD-10-CM | POA: Diagnosis not present

## 2020-01-17 DIAGNOSIS — S31829D Unspecified open wound of left buttock, subsequent encounter: Secondary | ICD-10-CM | POA: Diagnosis not present

## 2020-01-27 DIAGNOSIS — N179 Acute kidney failure, unspecified: Secondary | ICD-10-CM | POA: Diagnosis not present

## 2020-01-27 DIAGNOSIS — I509 Heart failure, unspecified: Secondary | ICD-10-CM | POA: Diagnosis not present

## 2020-01-27 DIAGNOSIS — K7460 Unspecified cirrhosis of liver: Secondary | ICD-10-CM | POA: Diagnosis not present

## 2020-01-27 DIAGNOSIS — Z794 Long term (current) use of insulin: Secondary | ICD-10-CM | POA: Diagnosis not present

## 2020-01-27 DIAGNOSIS — E119 Type 2 diabetes mellitus without complications: Secondary | ICD-10-CM | POA: Diagnosis not present

## 2020-01-27 DIAGNOSIS — L89153 Pressure ulcer of sacral region, stage 3: Secondary | ICD-10-CM | POA: Diagnosis not present

## 2020-01-27 DIAGNOSIS — L89323 Pressure ulcer of left buttock, stage 3: Secondary | ICD-10-CM | POA: Diagnosis not present

## 2020-01-27 DIAGNOSIS — Z9181 History of falling: Secondary | ICD-10-CM | POA: Diagnosis not present

## 2020-01-27 DIAGNOSIS — I110 Hypertensive heart disease with heart failure: Secondary | ICD-10-CM | POA: Diagnosis not present

## 2020-01-27 DIAGNOSIS — K219 Gastro-esophageal reflux disease without esophagitis: Secondary | ICD-10-CM | POA: Diagnosis not present

## 2020-01-27 DIAGNOSIS — Z79891 Long term (current) use of opiate analgesic: Secondary | ICD-10-CM | POA: Diagnosis not present

## 2020-01-30 DIAGNOSIS — K7460 Unspecified cirrhosis of liver: Secondary | ICD-10-CM | POA: Diagnosis not present

## 2020-01-30 DIAGNOSIS — K219 Gastro-esophageal reflux disease without esophagitis: Secondary | ICD-10-CM | POA: Diagnosis not present

## 2020-01-30 DIAGNOSIS — L89323 Pressure ulcer of left buttock, stage 3: Secondary | ICD-10-CM | POA: Diagnosis not present

## 2020-01-30 DIAGNOSIS — E119 Type 2 diabetes mellitus without complications: Secondary | ICD-10-CM | POA: Diagnosis not present

## 2020-01-30 DIAGNOSIS — G894 Chronic pain syndrome: Secondary | ICD-10-CM | POA: Diagnosis not present

## 2020-01-30 DIAGNOSIS — I509 Heart failure, unspecified: Secondary | ICD-10-CM | POA: Diagnosis not present

## 2020-01-30 DIAGNOSIS — E1142 Type 2 diabetes mellitus with diabetic polyneuropathy: Secondary | ICD-10-CM | POA: Diagnosis not present

## 2020-01-30 DIAGNOSIS — Z794 Long term (current) use of insulin: Secondary | ICD-10-CM | POA: Diagnosis not present

## 2020-01-30 DIAGNOSIS — M961 Postlaminectomy syndrome, not elsewhere classified: Secondary | ICD-10-CM | POA: Diagnosis not present

## 2020-01-30 DIAGNOSIS — N179 Acute kidney failure, unspecified: Secondary | ICD-10-CM | POA: Diagnosis not present

## 2020-01-30 DIAGNOSIS — Z79891 Long term (current) use of opiate analgesic: Secondary | ICD-10-CM | POA: Diagnosis not present

## 2020-01-30 DIAGNOSIS — Z9181 History of falling: Secondary | ICD-10-CM | POA: Diagnosis not present

## 2020-01-30 DIAGNOSIS — I110 Hypertensive heart disease with heart failure: Secondary | ICD-10-CM | POA: Diagnosis not present

## 2020-01-30 DIAGNOSIS — L89153 Pressure ulcer of sacral region, stage 3: Secondary | ICD-10-CM | POA: Diagnosis not present

## 2020-01-31 DIAGNOSIS — L89323 Pressure ulcer of left buttock, stage 3: Secondary | ICD-10-CM | POA: Diagnosis not present

## 2020-01-31 DIAGNOSIS — Z794 Long term (current) use of insulin: Secondary | ICD-10-CM | POA: Diagnosis not present

## 2020-01-31 DIAGNOSIS — K7460 Unspecified cirrhosis of liver: Secondary | ICD-10-CM | POA: Diagnosis not present

## 2020-01-31 DIAGNOSIS — L89153 Pressure ulcer of sacral region, stage 3: Secondary | ICD-10-CM | POA: Diagnosis not present

## 2020-01-31 DIAGNOSIS — K219 Gastro-esophageal reflux disease without esophagitis: Secondary | ICD-10-CM | POA: Diagnosis not present

## 2020-01-31 DIAGNOSIS — E119 Type 2 diabetes mellitus without complications: Secondary | ICD-10-CM | POA: Diagnosis not present

## 2020-01-31 DIAGNOSIS — I509 Heart failure, unspecified: Secondary | ICD-10-CM | POA: Diagnosis not present

## 2020-01-31 DIAGNOSIS — N179 Acute kidney failure, unspecified: Secondary | ICD-10-CM | POA: Diagnosis not present

## 2020-01-31 DIAGNOSIS — Z9181 History of falling: Secondary | ICD-10-CM | POA: Diagnosis not present

## 2020-01-31 DIAGNOSIS — I110 Hypertensive heart disease with heart failure: Secondary | ICD-10-CM | POA: Diagnosis not present

## 2020-01-31 DIAGNOSIS — Z79891 Long term (current) use of opiate analgesic: Secondary | ICD-10-CM | POA: Diagnosis not present

## 2020-02-01 DIAGNOSIS — I110 Hypertensive heart disease with heart failure: Secondary | ICD-10-CM | POA: Diagnosis not present

## 2020-02-01 DIAGNOSIS — K219 Gastro-esophageal reflux disease without esophagitis: Secondary | ICD-10-CM | POA: Diagnosis not present

## 2020-02-01 DIAGNOSIS — E876 Hypokalemia: Secondary | ICD-10-CM | POA: Diagnosis not present

## 2020-02-01 DIAGNOSIS — K7460 Unspecified cirrhosis of liver: Secondary | ICD-10-CM | POA: Diagnosis not present

## 2020-02-01 DIAGNOSIS — E119 Type 2 diabetes mellitus without complications: Secondary | ICD-10-CM | POA: Diagnosis not present

## 2020-02-01 DIAGNOSIS — L98429 Non-pressure chronic ulcer of back with unspecified severity: Secondary | ICD-10-CM | POA: Diagnosis not present

## 2020-02-01 DIAGNOSIS — Z79891 Long term (current) use of opiate analgesic: Secondary | ICD-10-CM | POA: Diagnosis not present

## 2020-02-01 DIAGNOSIS — Z794 Long term (current) use of insulin: Secondary | ICD-10-CM | POA: Diagnosis not present

## 2020-02-01 DIAGNOSIS — L89323 Pressure ulcer of left buttock, stage 3: Secondary | ICD-10-CM | POA: Diagnosis not present

## 2020-02-01 DIAGNOSIS — R296 Repeated falls: Secondary | ICD-10-CM | POA: Diagnosis not present

## 2020-02-01 DIAGNOSIS — Z79899 Other long term (current) drug therapy: Secondary | ICD-10-CM | POA: Diagnosis not present

## 2020-02-01 DIAGNOSIS — I509 Heart failure, unspecified: Secondary | ICD-10-CM | POA: Diagnosis not present

## 2020-02-01 DIAGNOSIS — N179 Acute kidney failure, unspecified: Secondary | ICD-10-CM | POA: Diagnosis not present

## 2020-02-01 DIAGNOSIS — L89153 Pressure ulcer of sacral region, stage 3: Secondary | ICD-10-CM | POA: Diagnosis not present

## 2020-02-01 DIAGNOSIS — Z9181 History of falling: Secondary | ICD-10-CM | POA: Diagnosis not present

## 2020-02-02 DIAGNOSIS — K7460 Unspecified cirrhosis of liver: Secondary | ICD-10-CM | POA: Diagnosis not present

## 2020-02-02 DIAGNOSIS — I110 Hypertensive heart disease with heart failure: Secondary | ICD-10-CM | POA: Diagnosis not present

## 2020-02-02 DIAGNOSIS — Z9181 History of falling: Secondary | ICD-10-CM | POA: Diagnosis not present

## 2020-02-02 DIAGNOSIS — K219 Gastro-esophageal reflux disease without esophagitis: Secondary | ICD-10-CM | POA: Diagnosis not present

## 2020-02-02 DIAGNOSIS — L89153 Pressure ulcer of sacral region, stage 3: Secondary | ICD-10-CM | POA: Diagnosis not present

## 2020-02-02 DIAGNOSIS — I509 Heart failure, unspecified: Secondary | ICD-10-CM | POA: Diagnosis not present

## 2020-02-02 DIAGNOSIS — Z79891 Long term (current) use of opiate analgesic: Secondary | ICD-10-CM | POA: Diagnosis not present

## 2020-02-02 DIAGNOSIS — Z794 Long term (current) use of insulin: Secondary | ICD-10-CM | POA: Diagnosis not present

## 2020-02-02 DIAGNOSIS — E119 Type 2 diabetes mellitus without complications: Secondary | ICD-10-CM | POA: Diagnosis not present

## 2020-02-02 DIAGNOSIS — L89323 Pressure ulcer of left buttock, stage 3: Secondary | ICD-10-CM | POA: Diagnosis not present

## 2020-02-02 DIAGNOSIS — N179 Acute kidney failure, unspecified: Secondary | ICD-10-CM | POA: Diagnosis not present

## 2020-02-03 DIAGNOSIS — I509 Heart failure, unspecified: Secondary | ICD-10-CM | POA: Diagnosis not present

## 2020-02-03 DIAGNOSIS — E119 Type 2 diabetes mellitus without complications: Secondary | ICD-10-CM | POA: Diagnosis not present

## 2020-02-03 DIAGNOSIS — K7460 Unspecified cirrhosis of liver: Secondary | ICD-10-CM | POA: Diagnosis not present

## 2020-02-03 DIAGNOSIS — Z9181 History of falling: Secondary | ICD-10-CM | POA: Diagnosis not present

## 2020-02-03 DIAGNOSIS — K219 Gastro-esophageal reflux disease without esophagitis: Secondary | ICD-10-CM | POA: Diagnosis not present

## 2020-02-03 DIAGNOSIS — I110 Hypertensive heart disease with heart failure: Secondary | ICD-10-CM | POA: Diagnosis not present

## 2020-02-03 DIAGNOSIS — N179 Acute kidney failure, unspecified: Secondary | ICD-10-CM | POA: Diagnosis not present

## 2020-02-03 DIAGNOSIS — Z794 Long term (current) use of insulin: Secondary | ICD-10-CM | POA: Diagnosis not present

## 2020-02-03 DIAGNOSIS — L89153 Pressure ulcer of sacral region, stage 3: Secondary | ICD-10-CM | POA: Diagnosis not present

## 2020-02-03 DIAGNOSIS — L89323 Pressure ulcer of left buttock, stage 3: Secondary | ICD-10-CM | POA: Diagnosis not present

## 2020-02-03 DIAGNOSIS — Z79891 Long term (current) use of opiate analgesic: Secondary | ICD-10-CM | POA: Diagnosis not present

## 2020-02-06 DIAGNOSIS — N179 Acute kidney failure, unspecified: Secondary | ICD-10-CM | POA: Diagnosis not present

## 2020-02-06 DIAGNOSIS — I110 Hypertensive heart disease with heart failure: Secondary | ICD-10-CM | POA: Diagnosis not present

## 2020-02-06 DIAGNOSIS — Z79891 Long term (current) use of opiate analgesic: Secondary | ICD-10-CM | POA: Diagnosis not present

## 2020-02-06 DIAGNOSIS — L89153 Pressure ulcer of sacral region, stage 3: Secondary | ICD-10-CM | POA: Diagnosis not present

## 2020-02-06 DIAGNOSIS — Z9181 History of falling: Secondary | ICD-10-CM | POA: Diagnosis not present

## 2020-02-06 DIAGNOSIS — L89323 Pressure ulcer of left buttock, stage 3: Secondary | ICD-10-CM | POA: Diagnosis not present

## 2020-02-06 DIAGNOSIS — E119 Type 2 diabetes mellitus without complications: Secondary | ICD-10-CM | POA: Diagnosis not present

## 2020-02-06 DIAGNOSIS — I509 Heart failure, unspecified: Secondary | ICD-10-CM | POA: Diagnosis not present

## 2020-02-06 DIAGNOSIS — Z794 Long term (current) use of insulin: Secondary | ICD-10-CM | POA: Diagnosis not present

## 2020-02-06 DIAGNOSIS — K7460 Unspecified cirrhosis of liver: Secondary | ICD-10-CM | POA: Diagnosis not present

## 2020-02-06 DIAGNOSIS — K219 Gastro-esophageal reflux disease without esophagitis: Secondary | ICD-10-CM | POA: Diagnosis not present

## 2020-02-07 DIAGNOSIS — I509 Heart failure, unspecified: Secondary | ICD-10-CM | POA: Diagnosis not present

## 2020-02-07 DIAGNOSIS — Z79891 Long term (current) use of opiate analgesic: Secondary | ICD-10-CM | POA: Diagnosis not present

## 2020-02-07 DIAGNOSIS — Z794 Long term (current) use of insulin: Secondary | ICD-10-CM | POA: Diagnosis not present

## 2020-02-07 DIAGNOSIS — K219 Gastro-esophageal reflux disease without esophagitis: Secondary | ICD-10-CM | POA: Diagnosis not present

## 2020-02-07 DIAGNOSIS — Z9181 History of falling: Secondary | ICD-10-CM | POA: Diagnosis not present

## 2020-02-07 DIAGNOSIS — K7460 Unspecified cirrhosis of liver: Secondary | ICD-10-CM | POA: Diagnosis not present

## 2020-02-07 DIAGNOSIS — N179 Acute kidney failure, unspecified: Secondary | ICD-10-CM | POA: Diagnosis not present

## 2020-02-07 DIAGNOSIS — L89153 Pressure ulcer of sacral region, stage 3: Secondary | ICD-10-CM | POA: Diagnosis not present

## 2020-02-07 DIAGNOSIS — L89323 Pressure ulcer of left buttock, stage 3: Secondary | ICD-10-CM | POA: Diagnosis not present

## 2020-02-07 DIAGNOSIS — E119 Type 2 diabetes mellitus without complications: Secondary | ICD-10-CM | POA: Diagnosis not present

## 2020-02-07 DIAGNOSIS — I110 Hypertensive heart disease with heart failure: Secondary | ICD-10-CM | POA: Diagnosis not present

## 2020-02-09 DIAGNOSIS — Z794 Long term (current) use of insulin: Secondary | ICD-10-CM | POA: Diagnosis not present

## 2020-02-09 DIAGNOSIS — E119 Type 2 diabetes mellitus without complications: Secondary | ICD-10-CM | POA: Diagnosis not present

## 2020-02-09 DIAGNOSIS — Z9181 History of falling: Secondary | ICD-10-CM | POA: Diagnosis not present

## 2020-02-09 DIAGNOSIS — L89153 Pressure ulcer of sacral region, stage 3: Secondary | ICD-10-CM | POA: Diagnosis not present

## 2020-02-09 DIAGNOSIS — K219 Gastro-esophageal reflux disease without esophagitis: Secondary | ICD-10-CM | POA: Diagnosis not present

## 2020-02-09 DIAGNOSIS — K7460 Unspecified cirrhosis of liver: Secondary | ICD-10-CM | POA: Diagnosis not present

## 2020-02-09 DIAGNOSIS — I509 Heart failure, unspecified: Secondary | ICD-10-CM | POA: Diagnosis not present

## 2020-02-09 DIAGNOSIS — L89323 Pressure ulcer of left buttock, stage 3: Secondary | ICD-10-CM | POA: Diagnosis not present

## 2020-02-09 DIAGNOSIS — N179 Acute kidney failure, unspecified: Secondary | ICD-10-CM | POA: Diagnosis not present

## 2020-02-09 DIAGNOSIS — Z79891 Long term (current) use of opiate analgesic: Secondary | ICD-10-CM | POA: Diagnosis not present

## 2020-02-09 DIAGNOSIS — I110 Hypertensive heart disease with heart failure: Secondary | ICD-10-CM | POA: Diagnosis not present

## 2020-02-14 DIAGNOSIS — I110 Hypertensive heart disease with heart failure: Secondary | ICD-10-CM | POA: Diagnosis not present

## 2020-02-14 DIAGNOSIS — E119 Type 2 diabetes mellitus without complications: Secondary | ICD-10-CM | POA: Diagnosis not present

## 2020-02-14 DIAGNOSIS — Z79891 Long term (current) use of opiate analgesic: Secondary | ICD-10-CM | POA: Diagnosis not present

## 2020-02-14 DIAGNOSIS — L89323 Pressure ulcer of left buttock, stage 3: Secondary | ICD-10-CM | POA: Diagnosis not present

## 2020-02-14 DIAGNOSIS — K219 Gastro-esophageal reflux disease without esophagitis: Secondary | ICD-10-CM | POA: Diagnosis not present

## 2020-02-14 DIAGNOSIS — Z9181 History of falling: Secondary | ICD-10-CM | POA: Diagnosis not present

## 2020-02-14 DIAGNOSIS — N179 Acute kidney failure, unspecified: Secondary | ICD-10-CM | POA: Diagnosis not present

## 2020-02-14 DIAGNOSIS — K7460 Unspecified cirrhosis of liver: Secondary | ICD-10-CM | POA: Diagnosis not present

## 2020-02-14 DIAGNOSIS — I509 Heart failure, unspecified: Secondary | ICD-10-CM | POA: Diagnosis not present

## 2020-02-14 DIAGNOSIS — L89153 Pressure ulcer of sacral region, stage 3: Secondary | ICD-10-CM | POA: Diagnosis not present

## 2020-02-14 DIAGNOSIS — Z794 Long term (current) use of insulin: Secondary | ICD-10-CM | POA: Diagnosis not present

## 2020-02-15 DIAGNOSIS — L89323 Pressure ulcer of left buttock, stage 3: Secondary | ICD-10-CM | POA: Diagnosis not present

## 2020-02-15 DIAGNOSIS — L89153 Pressure ulcer of sacral region, stage 3: Secondary | ICD-10-CM | POA: Diagnosis not present

## 2020-02-15 DIAGNOSIS — E119 Type 2 diabetes mellitus without complications: Secondary | ICD-10-CM | POA: Diagnosis not present

## 2020-02-15 DIAGNOSIS — Z794 Long term (current) use of insulin: Secondary | ICD-10-CM | POA: Diagnosis not present

## 2020-02-16 DIAGNOSIS — I110 Hypertensive heart disease with heart failure: Secondary | ICD-10-CM | POA: Diagnosis not present

## 2020-02-16 DIAGNOSIS — L89323 Pressure ulcer of left buttock, stage 3: Secondary | ICD-10-CM | POA: Diagnosis not present

## 2020-02-16 DIAGNOSIS — Z794 Long term (current) use of insulin: Secondary | ICD-10-CM | POA: Diagnosis not present

## 2020-02-16 DIAGNOSIS — Z9181 History of falling: Secondary | ICD-10-CM | POA: Diagnosis not present

## 2020-02-16 DIAGNOSIS — N179 Acute kidney failure, unspecified: Secondary | ICD-10-CM | POA: Diagnosis not present

## 2020-02-16 DIAGNOSIS — Z79891 Long term (current) use of opiate analgesic: Secondary | ICD-10-CM | POA: Diagnosis not present

## 2020-02-16 DIAGNOSIS — L89153 Pressure ulcer of sacral region, stage 3: Secondary | ICD-10-CM | POA: Diagnosis not present

## 2020-02-16 DIAGNOSIS — E119 Type 2 diabetes mellitus without complications: Secondary | ICD-10-CM | POA: Diagnosis not present

## 2020-02-16 DIAGNOSIS — K7460 Unspecified cirrhosis of liver: Secondary | ICD-10-CM | POA: Diagnosis not present

## 2020-02-16 DIAGNOSIS — I509 Heart failure, unspecified: Secondary | ICD-10-CM | POA: Diagnosis not present

## 2020-02-16 DIAGNOSIS — K219 Gastro-esophageal reflux disease without esophagitis: Secondary | ICD-10-CM | POA: Diagnosis not present

## 2020-02-21 DIAGNOSIS — Z794 Long term (current) use of insulin: Secondary | ICD-10-CM | POA: Diagnosis not present

## 2020-02-21 DIAGNOSIS — L89323 Pressure ulcer of left buttock, stage 3: Secondary | ICD-10-CM | POA: Diagnosis not present

## 2020-02-21 DIAGNOSIS — L89153 Pressure ulcer of sacral region, stage 3: Secondary | ICD-10-CM | POA: Diagnosis not present

## 2020-02-21 DIAGNOSIS — E119 Type 2 diabetes mellitus without complications: Secondary | ICD-10-CM | POA: Diagnosis not present

## 2020-02-22 DIAGNOSIS — Z9181 History of falling: Secondary | ICD-10-CM | POA: Diagnosis not present

## 2020-02-22 DIAGNOSIS — E119 Type 2 diabetes mellitus without complications: Secondary | ICD-10-CM | POA: Diagnosis not present

## 2020-02-22 DIAGNOSIS — I509 Heart failure, unspecified: Secondary | ICD-10-CM | POA: Diagnosis not present

## 2020-02-22 DIAGNOSIS — N179 Acute kidney failure, unspecified: Secondary | ICD-10-CM | POA: Diagnosis not present

## 2020-02-22 DIAGNOSIS — K219 Gastro-esophageal reflux disease without esophagitis: Secondary | ICD-10-CM | POA: Diagnosis not present

## 2020-02-22 DIAGNOSIS — L89153 Pressure ulcer of sacral region, stage 3: Secondary | ICD-10-CM | POA: Diagnosis not present

## 2020-02-22 DIAGNOSIS — Z794 Long term (current) use of insulin: Secondary | ICD-10-CM | POA: Diagnosis not present

## 2020-02-22 DIAGNOSIS — I110 Hypertensive heart disease with heart failure: Secondary | ICD-10-CM | POA: Diagnosis not present

## 2020-02-22 DIAGNOSIS — Z79891 Long term (current) use of opiate analgesic: Secondary | ICD-10-CM | POA: Diagnosis not present

## 2020-02-22 DIAGNOSIS — L89323 Pressure ulcer of left buttock, stage 3: Secondary | ICD-10-CM | POA: Diagnosis not present

## 2020-02-22 DIAGNOSIS — K7460 Unspecified cirrhosis of liver: Secondary | ICD-10-CM | POA: Diagnosis not present

## 2020-02-23 DIAGNOSIS — Z794 Long term (current) use of insulin: Secondary | ICD-10-CM | POA: Diagnosis not present

## 2020-02-23 DIAGNOSIS — L89153 Pressure ulcer of sacral region, stage 3: Secondary | ICD-10-CM | POA: Diagnosis not present

## 2020-02-23 DIAGNOSIS — N179 Acute kidney failure, unspecified: Secondary | ICD-10-CM | POA: Diagnosis not present

## 2020-02-23 DIAGNOSIS — I110 Hypertensive heart disease with heart failure: Secondary | ICD-10-CM | POA: Diagnosis not present

## 2020-02-23 DIAGNOSIS — K219 Gastro-esophageal reflux disease without esophagitis: Secondary | ICD-10-CM | POA: Diagnosis not present

## 2020-02-23 DIAGNOSIS — Z79891 Long term (current) use of opiate analgesic: Secondary | ICD-10-CM | POA: Diagnosis not present

## 2020-02-23 DIAGNOSIS — I509 Heart failure, unspecified: Secondary | ICD-10-CM | POA: Diagnosis not present

## 2020-02-23 DIAGNOSIS — Z9181 History of falling: Secondary | ICD-10-CM | POA: Diagnosis not present

## 2020-02-23 DIAGNOSIS — E119 Type 2 diabetes mellitus without complications: Secondary | ICD-10-CM | POA: Diagnosis not present

## 2020-02-23 DIAGNOSIS — K7460 Unspecified cirrhosis of liver: Secondary | ICD-10-CM | POA: Diagnosis not present

## 2020-02-23 DIAGNOSIS — L89323 Pressure ulcer of left buttock, stage 3: Secondary | ICD-10-CM | POA: Diagnosis not present

## 2020-02-27 DIAGNOSIS — M961 Postlaminectomy syndrome, not elsewhere classified: Secondary | ICD-10-CM | POA: Diagnosis not present

## 2020-02-27 DIAGNOSIS — E1142 Type 2 diabetes mellitus with diabetic polyneuropathy: Secondary | ICD-10-CM | POA: Diagnosis not present

## 2020-02-27 DIAGNOSIS — G894 Chronic pain syndrome: Secondary | ICD-10-CM | POA: Diagnosis not present

## 2020-02-27 DIAGNOSIS — Z79891 Long term (current) use of opiate analgesic: Secondary | ICD-10-CM | POA: Diagnosis not present

## 2020-02-28 DIAGNOSIS — S31829D Unspecified open wound of left buttock, subsequent encounter: Secondary | ICD-10-CM | POA: Diagnosis not present

## 2020-03-02 DIAGNOSIS — E119 Type 2 diabetes mellitus without complications: Secondary | ICD-10-CM | POA: Diagnosis not present

## 2020-03-02 DIAGNOSIS — Z794 Long term (current) use of insulin: Secondary | ICD-10-CM | POA: Diagnosis not present

## 2020-03-02 DIAGNOSIS — L89153 Pressure ulcer of sacral region, stage 3: Secondary | ICD-10-CM | POA: Diagnosis not present

## 2020-03-02 DIAGNOSIS — Z79891 Long term (current) use of opiate analgesic: Secondary | ICD-10-CM | POA: Diagnosis not present

## 2020-03-02 DIAGNOSIS — Z9181 History of falling: Secondary | ICD-10-CM | POA: Diagnosis not present

## 2020-03-02 DIAGNOSIS — N179 Acute kidney failure, unspecified: Secondary | ICD-10-CM | POA: Diagnosis not present

## 2020-03-02 DIAGNOSIS — I110 Hypertensive heart disease with heart failure: Secondary | ICD-10-CM | POA: Diagnosis not present

## 2020-03-02 DIAGNOSIS — K219 Gastro-esophageal reflux disease without esophagitis: Secondary | ICD-10-CM | POA: Diagnosis not present

## 2020-03-02 DIAGNOSIS — K7460 Unspecified cirrhosis of liver: Secondary | ICD-10-CM | POA: Diagnosis not present

## 2020-03-02 DIAGNOSIS — L89323 Pressure ulcer of left buttock, stage 3: Secondary | ICD-10-CM | POA: Diagnosis not present

## 2020-03-02 DIAGNOSIS — I509 Heart failure, unspecified: Secondary | ICD-10-CM | POA: Diagnosis not present

## 2020-03-08 DIAGNOSIS — N179 Acute kidney failure, unspecified: Secondary | ICD-10-CM | POA: Diagnosis not present

## 2020-03-08 DIAGNOSIS — L89153 Pressure ulcer of sacral region, stage 3: Secondary | ICD-10-CM | POA: Diagnosis not present

## 2020-03-08 DIAGNOSIS — I509 Heart failure, unspecified: Secondary | ICD-10-CM | POA: Diagnosis not present

## 2020-03-08 DIAGNOSIS — Z794 Long term (current) use of insulin: Secondary | ICD-10-CM | POA: Diagnosis not present

## 2020-03-08 DIAGNOSIS — K7460 Unspecified cirrhosis of liver: Secondary | ICD-10-CM | POA: Diagnosis not present

## 2020-03-08 DIAGNOSIS — E119 Type 2 diabetes mellitus without complications: Secondary | ICD-10-CM | POA: Diagnosis not present

## 2020-03-08 DIAGNOSIS — K219 Gastro-esophageal reflux disease without esophagitis: Secondary | ICD-10-CM | POA: Diagnosis not present

## 2020-03-08 DIAGNOSIS — Z9181 History of falling: Secondary | ICD-10-CM | POA: Diagnosis not present

## 2020-03-08 DIAGNOSIS — L89323 Pressure ulcer of left buttock, stage 3: Secondary | ICD-10-CM | POA: Diagnosis not present

## 2020-03-08 DIAGNOSIS — I110 Hypertensive heart disease with heart failure: Secondary | ICD-10-CM | POA: Diagnosis not present

## 2020-03-08 DIAGNOSIS — Z79891 Long term (current) use of opiate analgesic: Secondary | ICD-10-CM | POA: Diagnosis not present

## 2020-03-15 DIAGNOSIS — I509 Heart failure, unspecified: Secondary | ICD-10-CM | POA: Diagnosis not present

## 2020-03-15 DIAGNOSIS — L89323 Pressure ulcer of left buttock, stage 3: Secondary | ICD-10-CM | POA: Diagnosis not present

## 2020-03-15 DIAGNOSIS — Z9181 History of falling: Secondary | ICD-10-CM | POA: Diagnosis not present

## 2020-03-15 DIAGNOSIS — I110 Hypertensive heart disease with heart failure: Secondary | ICD-10-CM | POA: Diagnosis not present

## 2020-03-15 DIAGNOSIS — Z794 Long term (current) use of insulin: Secondary | ICD-10-CM | POA: Diagnosis not present

## 2020-03-15 DIAGNOSIS — N179 Acute kidney failure, unspecified: Secondary | ICD-10-CM | POA: Diagnosis not present

## 2020-03-15 DIAGNOSIS — L89153 Pressure ulcer of sacral region, stage 3: Secondary | ICD-10-CM | POA: Diagnosis not present

## 2020-03-15 DIAGNOSIS — K7460 Unspecified cirrhosis of liver: Secondary | ICD-10-CM | POA: Diagnosis not present

## 2020-03-15 DIAGNOSIS — E119 Type 2 diabetes mellitus without complications: Secondary | ICD-10-CM | POA: Diagnosis not present

## 2020-03-15 DIAGNOSIS — K219 Gastro-esophageal reflux disease without esophagitis: Secondary | ICD-10-CM | POA: Diagnosis not present

## 2020-03-15 DIAGNOSIS — Z79891 Long term (current) use of opiate analgesic: Secondary | ICD-10-CM | POA: Diagnosis not present

## 2020-03-16 DIAGNOSIS — L89323 Pressure ulcer of left buttock, stage 3: Secondary | ICD-10-CM | POA: Diagnosis not present

## 2020-03-16 DIAGNOSIS — Z794 Long term (current) use of insulin: Secondary | ICD-10-CM | POA: Diagnosis not present

## 2020-03-16 DIAGNOSIS — E119 Type 2 diabetes mellitus without complications: Secondary | ICD-10-CM | POA: Diagnosis not present

## 2020-03-16 DIAGNOSIS — L89153 Pressure ulcer of sacral region, stage 3: Secondary | ICD-10-CM | POA: Diagnosis not present

## 2020-03-23 DIAGNOSIS — N179 Acute kidney failure, unspecified: Secondary | ICD-10-CM | POA: Diagnosis not present

## 2020-03-23 DIAGNOSIS — Z9181 History of falling: Secondary | ICD-10-CM | POA: Diagnosis not present

## 2020-03-23 DIAGNOSIS — L89153 Pressure ulcer of sacral region, stage 3: Secondary | ICD-10-CM | POA: Diagnosis not present

## 2020-03-23 DIAGNOSIS — I110 Hypertensive heart disease with heart failure: Secondary | ICD-10-CM | POA: Diagnosis not present

## 2020-03-23 DIAGNOSIS — K219 Gastro-esophageal reflux disease without esophagitis: Secondary | ICD-10-CM | POA: Diagnosis not present

## 2020-03-23 DIAGNOSIS — Z794 Long term (current) use of insulin: Secondary | ICD-10-CM | POA: Diagnosis not present

## 2020-03-23 DIAGNOSIS — Z79891 Long term (current) use of opiate analgesic: Secondary | ICD-10-CM | POA: Diagnosis not present

## 2020-03-23 DIAGNOSIS — E119 Type 2 diabetes mellitus without complications: Secondary | ICD-10-CM | POA: Diagnosis not present

## 2020-03-23 DIAGNOSIS — I509 Heart failure, unspecified: Secondary | ICD-10-CM | POA: Diagnosis not present

## 2020-03-23 DIAGNOSIS — K7460 Unspecified cirrhosis of liver: Secondary | ICD-10-CM | POA: Diagnosis not present

## 2020-03-23 DIAGNOSIS — L89323 Pressure ulcer of left buttock, stage 3: Secondary | ICD-10-CM | POA: Diagnosis not present

## 2020-03-26 DIAGNOSIS — G894 Chronic pain syndrome: Secondary | ICD-10-CM | POA: Diagnosis not present

## 2020-03-26 DIAGNOSIS — Z79891 Long term (current) use of opiate analgesic: Secondary | ICD-10-CM | POA: Diagnosis not present

## 2020-03-26 DIAGNOSIS — M961 Postlaminectomy syndrome, not elsewhere classified: Secondary | ICD-10-CM | POA: Diagnosis not present

## 2020-03-26 DIAGNOSIS — E1142 Type 2 diabetes mellitus with diabetic polyneuropathy: Secondary | ICD-10-CM | POA: Diagnosis not present

## 2020-03-27 DIAGNOSIS — E1140 Type 2 diabetes mellitus with diabetic neuropathy, unspecified: Secondary | ICD-10-CM | POA: Diagnosis not present

## 2020-03-27 DIAGNOSIS — Z79899 Other long term (current) drug therapy: Secondary | ICD-10-CM | POA: Diagnosis not present

## 2020-03-27 DIAGNOSIS — E1165 Type 2 diabetes mellitus with hyperglycemia: Secondary | ICD-10-CM | POA: Diagnosis not present

## 2020-04-06 DIAGNOSIS — L89153 Pressure ulcer of sacral region, stage 3: Secondary | ICD-10-CM | POA: Diagnosis not present

## 2020-04-06 DIAGNOSIS — E119 Type 2 diabetes mellitus without complications: Secondary | ICD-10-CM | POA: Diagnosis not present

## 2020-04-06 DIAGNOSIS — Z794 Long term (current) use of insulin: Secondary | ICD-10-CM | POA: Diagnosis not present

## 2020-04-06 DIAGNOSIS — L89323 Pressure ulcer of left buttock, stage 3: Secondary | ICD-10-CM | POA: Diagnosis not present

## 2020-04-16 DIAGNOSIS — E1142 Type 2 diabetes mellitus with diabetic polyneuropathy: Secondary | ICD-10-CM | POA: Diagnosis not present

## 2020-04-16 DIAGNOSIS — M961 Postlaminectomy syndrome, not elsewhere classified: Secondary | ICD-10-CM | POA: Diagnosis not present

## 2020-04-16 DIAGNOSIS — G894 Chronic pain syndrome: Secondary | ICD-10-CM | POA: Diagnosis not present

## 2020-04-16 DIAGNOSIS — Z79891 Long term (current) use of opiate analgesic: Secondary | ICD-10-CM | POA: Diagnosis not present

## 2020-04-25 DIAGNOSIS — Z794 Long term (current) use of insulin: Secondary | ICD-10-CM | POA: Diagnosis not present

## 2020-04-25 DIAGNOSIS — E119 Type 2 diabetes mellitus without complications: Secondary | ICD-10-CM | POA: Diagnosis not present

## 2020-04-25 DIAGNOSIS — L89323 Pressure ulcer of left buttock, stage 3: Secondary | ICD-10-CM | POA: Diagnosis not present

## 2020-04-25 DIAGNOSIS — L89153 Pressure ulcer of sacral region, stage 3: Secondary | ICD-10-CM | POA: Diagnosis not present

## 2020-05-02 DIAGNOSIS — Z79899 Other long term (current) drug therapy: Secondary | ICD-10-CM | POA: Diagnosis not present

## 2020-05-06 DIAGNOSIS — L89153 Pressure ulcer of sacral region, stage 3: Secondary | ICD-10-CM | POA: Diagnosis not present

## 2020-05-06 DIAGNOSIS — L89323 Pressure ulcer of left buttock, stage 3: Secondary | ICD-10-CM | POA: Diagnosis not present

## 2020-05-06 DIAGNOSIS — E119 Type 2 diabetes mellitus without complications: Secondary | ICD-10-CM | POA: Diagnosis not present

## 2020-05-06 DIAGNOSIS — Z794 Long term (current) use of insulin: Secondary | ICD-10-CM | POA: Diagnosis not present

## 2020-05-14 DIAGNOSIS — G894 Chronic pain syndrome: Secondary | ICD-10-CM | POA: Diagnosis not present

## 2020-05-14 DIAGNOSIS — Z79891 Long term (current) use of opiate analgesic: Secondary | ICD-10-CM | POA: Diagnosis not present

## 2020-05-14 DIAGNOSIS — M961 Postlaminectomy syndrome, not elsewhere classified: Secondary | ICD-10-CM | POA: Diagnosis not present

## 2020-05-14 DIAGNOSIS — E1142 Type 2 diabetes mellitus with diabetic polyneuropathy: Secondary | ICD-10-CM | POA: Diagnosis not present

## 2020-05-31 DIAGNOSIS — Z79899 Other long term (current) drug therapy: Secondary | ICD-10-CM | POA: Diagnosis not present

## 2020-05-31 DIAGNOSIS — J302 Other seasonal allergic rhinitis: Secondary | ICD-10-CM | POA: Diagnosis not present

## 2020-06-04 DIAGNOSIS — D500 Iron deficiency anemia secondary to blood loss (chronic): Secondary | ICD-10-CM | POA: Diagnosis not present

## 2020-06-04 DIAGNOSIS — K7460 Unspecified cirrhosis of liver: Secondary | ICD-10-CM | POA: Diagnosis not present

## 2020-06-05 DIAGNOSIS — K7460 Unspecified cirrhosis of liver: Secondary | ICD-10-CM | POA: Diagnosis not present

## 2020-06-05 DIAGNOSIS — K589 Irritable bowel syndrome without diarrhea: Secondary | ICD-10-CM | POA: Diagnosis not present

## 2020-06-05 DIAGNOSIS — D500 Iron deficiency anemia secondary to blood loss (chronic): Secondary | ICD-10-CM | POA: Diagnosis not present

## 2020-06-05 DIAGNOSIS — K766 Portal hypertension: Secondary | ICD-10-CM | POA: Diagnosis not present

## 2020-06-05 DIAGNOSIS — K219 Gastro-esophageal reflux disease without esophagitis: Secondary | ICD-10-CM | POA: Diagnosis not present

## 2020-06-06 DIAGNOSIS — L89153 Pressure ulcer of sacral region, stage 3: Secondary | ICD-10-CM | POA: Diagnosis not present

## 2020-06-06 DIAGNOSIS — L89323 Pressure ulcer of left buttock, stage 3: Secondary | ICD-10-CM | POA: Diagnosis not present

## 2020-06-06 DIAGNOSIS — E119 Type 2 diabetes mellitus without complications: Secondary | ICD-10-CM | POA: Diagnosis not present

## 2020-06-06 DIAGNOSIS — Z794 Long term (current) use of insulin: Secondary | ICD-10-CM | POA: Diagnosis not present

## 2020-06-11 DIAGNOSIS — Z79891 Long term (current) use of opiate analgesic: Secondary | ICD-10-CM | POA: Diagnosis not present

## 2020-06-11 DIAGNOSIS — E1142 Type 2 diabetes mellitus with diabetic polyneuropathy: Secondary | ICD-10-CM | POA: Diagnosis not present

## 2020-06-11 DIAGNOSIS — M961 Postlaminectomy syndrome, not elsewhere classified: Secondary | ICD-10-CM | POA: Diagnosis not present

## 2020-06-11 DIAGNOSIS — G894 Chronic pain syndrome: Secondary | ICD-10-CM | POA: Diagnosis not present

## 2020-06-29 DIAGNOSIS — Z9181 History of falling: Secondary | ICD-10-CM | POA: Diagnosis not present

## 2020-06-29 DIAGNOSIS — Z0000 Encounter for general adult medical examination without abnormal findings: Secondary | ICD-10-CM | POA: Diagnosis not present

## 2020-06-29 DIAGNOSIS — E785 Hyperlipidemia, unspecified: Secondary | ICD-10-CM | POA: Diagnosis not present

## 2020-07-05 DIAGNOSIS — L89153 Pressure ulcer of sacral region, stage 3: Secondary | ICD-10-CM | POA: Diagnosis not present

## 2020-07-05 DIAGNOSIS — L89323 Pressure ulcer of left buttock, stage 3: Secondary | ICD-10-CM | POA: Diagnosis not present

## 2020-07-05 DIAGNOSIS — Z794 Long term (current) use of insulin: Secondary | ICD-10-CM | POA: Diagnosis not present

## 2020-07-05 DIAGNOSIS — E119 Type 2 diabetes mellitus without complications: Secondary | ICD-10-CM | POA: Diagnosis not present

## 2020-07-10 DIAGNOSIS — G894 Chronic pain syndrome: Secondary | ICD-10-CM | POA: Diagnosis not present

## 2020-07-10 DIAGNOSIS — M961 Postlaminectomy syndrome, not elsewhere classified: Secondary | ICD-10-CM | POA: Diagnosis not present

## 2020-07-10 DIAGNOSIS — E1142 Type 2 diabetes mellitus with diabetic polyneuropathy: Secondary | ICD-10-CM | POA: Diagnosis not present

## 2020-07-10 DIAGNOSIS — Z79891 Long term (current) use of opiate analgesic: Secondary | ICD-10-CM | POA: Diagnosis not present

## 2020-07-23 DIAGNOSIS — Z23 Encounter for immunization: Secondary | ICD-10-CM | POA: Diagnosis not present

## 2020-07-25 DIAGNOSIS — Z79899 Other long term (current) drug therapy: Secondary | ICD-10-CM | POA: Diagnosis not present

## 2020-08-04 DIAGNOSIS — L89323 Pressure ulcer of left buttock, stage 3: Secondary | ICD-10-CM | POA: Diagnosis not present

## 2020-08-04 DIAGNOSIS — L89153 Pressure ulcer of sacral region, stage 3: Secondary | ICD-10-CM | POA: Diagnosis not present

## 2020-08-04 DIAGNOSIS — E119 Type 2 diabetes mellitus without complications: Secondary | ICD-10-CM | POA: Diagnosis not present

## 2020-08-04 DIAGNOSIS — Z794 Long term (current) use of insulin: Secondary | ICD-10-CM | POA: Diagnosis not present

## 2020-08-07 DIAGNOSIS — M961 Postlaminectomy syndrome, not elsewhere classified: Secondary | ICD-10-CM | POA: Diagnosis not present

## 2020-08-07 DIAGNOSIS — Z79891 Long term (current) use of opiate analgesic: Secondary | ICD-10-CM | POA: Diagnosis not present

## 2020-08-07 DIAGNOSIS — G894 Chronic pain syndrome: Secondary | ICD-10-CM | POA: Diagnosis not present

## 2020-08-07 DIAGNOSIS — E1142 Type 2 diabetes mellitus with diabetic polyneuropathy: Secondary | ICD-10-CM | POA: Diagnosis not present

## 2020-08-28 DIAGNOSIS — Z79899 Other long term (current) drug therapy: Secondary | ICD-10-CM | POA: Diagnosis not present

## 2020-09-04 DIAGNOSIS — M961 Postlaminectomy syndrome, not elsewhere classified: Secondary | ICD-10-CM | POA: Diagnosis not present

## 2020-09-04 DIAGNOSIS — E119 Type 2 diabetes mellitus without complications: Secondary | ICD-10-CM | POA: Diagnosis not present

## 2020-09-04 DIAGNOSIS — G894 Chronic pain syndrome: Secondary | ICD-10-CM | POA: Diagnosis not present

## 2020-09-04 DIAGNOSIS — E1142 Type 2 diabetes mellitus with diabetic polyneuropathy: Secondary | ICD-10-CM | POA: Diagnosis not present

## 2020-09-04 DIAGNOSIS — L89153 Pressure ulcer of sacral region, stage 3: Secondary | ICD-10-CM | POA: Diagnosis not present

## 2020-09-04 DIAGNOSIS — Z79891 Long term (current) use of opiate analgesic: Secondary | ICD-10-CM | POA: Diagnosis not present

## 2020-09-04 DIAGNOSIS — Z794 Long term (current) use of insulin: Secondary | ICD-10-CM | POA: Diagnosis not present

## 2020-09-04 DIAGNOSIS — L89323 Pressure ulcer of left buttock, stage 3: Secondary | ICD-10-CM | POA: Diagnosis not present

## 2020-09-27 DIAGNOSIS — E785 Hyperlipidemia, unspecified: Secondary | ICD-10-CM | POA: Diagnosis not present

## 2020-09-27 DIAGNOSIS — E1165 Type 2 diabetes mellitus with hyperglycemia: Secondary | ICD-10-CM | POA: Diagnosis not present

## 2020-09-27 DIAGNOSIS — E1140 Type 2 diabetes mellitus with diabetic neuropathy, unspecified: Secondary | ICD-10-CM | POA: Diagnosis not present

## 2020-09-27 DIAGNOSIS — D519 Vitamin B12 deficiency anemia, unspecified: Secondary | ICD-10-CM | POA: Diagnosis not present

## 2020-10-02 DIAGNOSIS — G894 Chronic pain syndrome: Secondary | ICD-10-CM | POA: Diagnosis not present

## 2020-10-02 DIAGNOSIS — M961 Postlaminectomy syndrome, not elsewhere classified: Secondary | ICD-10-CM | POA: Diagnosis not present

## 2020-10-02 DIAGNOSIS — Z79891 Long term (current) use of opiate analgesic: Secondary | ICD-10-CM | POA: Diagnosis not present

## 2020-10-02 DIAGNOSIS — E1142 Type 2 diabetes mellitus with diabetic polyneuropathy: Secondary | ICD-10-CM | POA: Diagnosis not present

## 2020-10-04 DIAGNOSIS — E119 Type 2 diabetes mellitus without complications: Secondary | ICD-10-CM | POA: Diagnosis not present

## 2020-10-04 DIAGNOSIS — Z794 Long term (current) use of insulin: Secondary | ICD-10-CM | POA: Diagnosis not present

## 2020-10-04 DIAGNOSIS — L89323 Pressure ulcer of left buttock, stage 3: Secondary | ICD-10-CM | POA: Diagnosis not present

## 2020-10-04 DIAGNOSIS — L89153 Pressure ulcer of sacral region, stage 3: Secondary | ICD-10-CM | POA: Diagnosis not present

## 2020-10-25 DIAGNOSIS — Z79899 Other long term (current) drug therapy: Secondary | ICD-10-CM | POA: Diagnosis not present

## 2020-10-30 DIAGNOSIS — G894 Chronic pain syndrome: Secondary | ICD-10-CM | POA: Diagnosis not present

## 2020-10-30 DIAGNOSIS — M961 Postlaminectomy syndrome, not elsewhere classified: Secondary | ICD-10-CM | POA: Diagnosis not present

## 2020-10-30 DIAGNOSIS — E1142 Type 2 diabetes mellitus with diabetic polyneuropathy: Secondary | ICD-10-CM | POA: Diagnosis not present

## 2020-10-30 DIAGNOSIS — Z79891 Long term (current) use of opiate analgesic: Secondary | ICD-10-CM | POA: Diagnosis not present

## 2020-11-04 DIAGNOSIS — E119 Type 2 diabetes mellitus without complications: Secondary | ICD-10-CM | POA: Diagnosis not present

## 2020-11-04 DIAGNOSIS — L89153 Pressure ulcer of sacral region, stage 3: Secondary | ICD-10-CM | POA: Diagnosis not present

## 2020-11-04 DIAGNOSIS — L89323 Pressure ulcer of left buttock, stage 3: Secondary | ICD-10-CM | POA: Diagnosis not present

## 2020-11-04 DIAGNOSIS — Z794 Long term (current) use of insulin: Secondary | ICD-10-CM | POA: Diagnosis not present

## 2020-11-23 DIAGNOSIS — J069 Acute upper respiratory infection, unspecified: Secondary | ICD-10-CM | POA: Diagnosis not present

## 2020-11-23 DIAGNOSIS — R0982 Postnasal drip: Secondary | ICD-10-CM | POA: Diagnosis not present

## 2020-12-03 DIAGNOSIS — Z79891 Long term (current) use of opiate analgesic: Secondary | ICD-10-CM | POA: Diagnosis not present

## 2020-12-03 DIAGNOSIS — M961 Postlaminectomy syndrome, not elsewhere classified: Secondary | ICD-10-CM | POA: Diagnosis not present

## 2020-12-03 DIAGNOSIS — G894 Chronic pain syndrome: Secondary | ICD-10-CM | POA: Diagnosis not present

## 2020-12-03 DIAGNOSIS — E1142 Type 2 diabetes mellitus with diabetic polyneuropathy: Secondary | ICD-10-CM | POA: Diagnosis not present

## 2020-12-05 DIAGNOSIS — L89153 Pressure ulcer of sacral region, stage 3: Secondary | ICD-10-CM | POA: Diagnosis not present

## 2020-12-05 DIAGNOSIS — E119 Type 2 diabetes mellitus without complications: Secondary | ICD-10-CM | POA: Diagnosis not present

## 2020-12-05 DIAGNOSIS — Z794 Long term (current) use of insulin: Secondary | ICD-10-CM | POA: Diagnosis not present

## 2020-12-05 DIAGNOSIS — L89323 Pressure ulcer of left buttock, stage 3: Secondary | ICD-10-CM | POA: Diagnosis not present

## 2020-12-24 DIAGNOSIS — K7460 Unspecified cirrhosis of liver: Secondary | ICD-10-CM | POA: Diagnosis not present

## 2020-12-24 DIAGNOSIS — K7581 Nonalcoholic steatohepatitis (NASH): Secondary | ICD-10-CM | POA: Diagnosis not present

## 2020-12-27 DIAGNOSIS — K2960 Other gastritis without bleeding: Secondary | ICD-10-CM | POA: Diagnosis not present

## 2020-12-27 DIAGNOSIS — K7460 Unspecified cirrhosis of liver: Secondary | ICD-10-CM | POA: Diagnosis not present

## 2020-12-27 DIAGNOSIS — K5090 Crohn's disease, unspecified, without complications: Secondary | ICD-10-CM | POA: Diagnosis not present

## 2020-12-27 DIAGNOSIS — K219 Gastro-esophageal reflux disease without esophagitis: Secondary | ICD-10-CM | POA: Diagnosis not present

## 2020-12-27 DIAGNOSIS — D500 Iron deficiency anemia secondary to blood loss (chronic): Secondary | ICD-10-CM | POA: Diagnosis not present

## 2020-12-27 DIAGNOSIS — K589 Irritable bowel syndrome without diarrhea: Secondary | ICD-10-CM | POA: Diagnosis not present

## 2020-12-31 DIAGNOSIS — E1142 Type 2 diabetes mellitus with diabetic polyneuropathy: Secondary | ICD-10-CM | POA: Diagnosis not present

## 2020-12-31 DIAGNOSIS — G894 Chronic pain syndrome: Secondary | ICD-10-CM | POA: Diagnosis not present

## 2020-12-31 DIAGNOSIS — M961 Postlaminectomy syndrome, not elsewhere classified: Secondary | ICD-10-CM | POA: Diagnosis not present

## 2020-12-31 DIAGNOSIS — Z79891 Long term (current) use of opiate analgesic: Secondary | ICD-10-CM | POA: Diagnosis not present

## 2021-01-07 DIAGNOSIS — E119 Type 2 diabetes mellitus without complications: Secondary | ICD-10-CM | POA: Diagnosis not present

## 2021-01-07 DIAGNOSIS — Z794 Long term (current) use of insulin: Secondary | ICD-10-CM | POA: Diagnosis not present

## 2021-01-07 DIAGNOSIS — L89323 Pressure ulcer of left buttock, stage 3: Secondary | ICD-10-CM | POA: Diagnosis not present

## 2021-01-07 DIAGNOSIS — L89153 Pressure ulcer of sacral region, stage 3: Secondary | ICD-10-CM | POA: Diagnosis not present

## 2021-01-28 DIAGNOSIS — Z79891 Long term (current) use of opiate analgesic: Secondary | ICD-10-CM | POA: Diagnosis not present

## 2021-01-28 DIAGNOSIS — E1142 Type 2 diabetes mellitus with diabetic polyneuropathy: Secondary | ICD-10-CM | POA: Diagnosis not present

## 2021-01-28 DIAGNOSIS — M961 Postlaminectomy syndrome, not elsewhere classified: Secondary | ICD-10-CM | POA: Diagnosis not present

## 2021-01-28 DIAGNOSIS — G894 Chronic pain syndrome: Secondary | ICD-10-CM | POA: Diagnosis not present

## 2021-02-07 DIAGNOSIS — L89153 Pressure ulcer of sacral region, stage 3: Secondary | ICD-10-CM | POA: Diagnosis not present

## 2021-02-07 DIAGNOSIS — Z794 Long term (current) use of insulin: Secondary | ICD-10-CM | POA: Diagnosis not present

## 2021-02-07 DIAGNOSIS — E119 Type 2 diabetes mellitus without complications: Secondary | ICD-10-CM | POA: Diagnosis not present

## 2021-02-07 DIAGNOSIS — L89323 Pressure ulcer of left buttock, stage 3: Secondary | ICD-10-CM | POA: Diagnosis not present

## 2021-02-25 DIAGNOSIS — E1142 Type 2 diabetes mellitus with diabetic polyneuropathy: Secondary | ICD-10-CM | POA: Diagnosis not present

## 2021-02-25 DIAGNOSIS — Z79891 Long term (current) use of opiate analgesic: Secondary | ICD-10-CM | POA: Diagnosis not present

## 2021-02-25 DIAGNOSIS — G894 Chronic pain syndrome: Secondary | ICD-10-CM | POA: Diagnosis not present

## 2021-02-25 DIAGNOSIS — M961 Postlaminectomy syndrome, not elsewhere classified: Secondary | ICD-10-CM | POA: Diagnosis not present

## 2021-03-09 DIAGNOSIS — L89153 Pressure ulcer of sacral region, stage 3: Secondary | ICD-10-CM | POA: Diagnosis not present

## 2021-03-09 DIAGNOSIS — L89323 Pressure ulcer of left buttock, stage 3: Secondary | ICD-10-CM | POA: Diagnosis not present

## 2021-03-09 DIAGNOSIS — Z794 Long term (current) use of insulin: Secondary | ICD-10-CM | POA: Diagnosis not present

## 2021-03-09 DIAGNOSIS — E119 Type 2 diabetes mellitus without complications: Secondary | ICD-10-CM | POA: Diagnosis not present

## 2021-03-25 DIAGNOSIS — E1142 Type 2 diabetes mellitus with diabetic polyneuropathy: Secondary | ICD-10-CM | POA: Diagnosis not present

## 2021-03-25 DIAGNOSIS — G894 Chronic pain syndrome: Secondary | ICD-10-CM | POA: Diagnosis not present

## 2021-03-25 DIAGNOSIS — Z79891 Long term (current) use of opiate analgesic: Secondary | ICD-10-CM | POA: Diagnosis not present

## 2021-03-25 DIAGNOSIS — M961 Postlaminectomy syndrome, not elsewhere classified: Secondary | ICD-10-CM | POA: Diagnosis not present

## 2021-04-09 DIAGNOSIS — L89153 Pressure ulcer of sacral region, stage 3: Secondary | ICD-10-CM | POA: Diagnosis not present

## 2021-04-09 DIAGNOSIS — Z794 Long term (current) use of insulin: Secondary | ICD-10-CM | POA: Diagnosis not present

## 2021-04-09 DIAGNOSIS — E119 Type 2 diabetes mellitus without complications: Secondary | ICD-10-CM | POA: Diagnosis not present

## 2021-04-09 DIAGNOSIS — L89323 Pressure ulcer of left buttock, stage 3: Secondary | ICD-10-CM | POA: Diagnosis not present

## 2021-04-25 DIAGNOSIS — Z79891 Long term (current) use of opiate analgesic: Secondary | ICD-10-CM | POA: Diagnosis not present

## 2021-04-25 DIAGNOSIS — E1142 Type 2 diabetes mellitus with diabetic polyneuropathy: Secondary | ICD-10-CM | POA: Diagnosis not present

## 2021-04-25 DIAGNOSIS — M961 Postlaminectomy syndrome, not elsewhere classified: Secondary | ICD-10-CM | POA: Diagnosis not present

## 2021-04-25 DIAGNOSIS — G894 Chronic pain syndrome: Secondary | ICD-10-CM | POA: Diagnosis not present

## 2021-05-09 DIAGNOSIS — E119 Type 2 diabetes mellitus without complications: Secondary | ICD-10-CM | POA: Diagnosis not present

## 2021-05-09 DIAGNOSIS — Z794 Long term (current) use of insulin: Secondary | ICD-10-CM | POA: Diagnosis not present

## 2021-05-09 DIAGNOSIS — L89323 Pressure ulcer of left buttock, stage 3: Secondary | ICD-10-CM | POA: Diagnosis not present

## 2021-05-09 DIAGNOSIS — L89153 Pressure ulcer of sacral region, stage 3: Secondary | ICD-10-CM | POA: Diagnosis not present

## 2021-05-23 DIAGNOSIS — Z79891 Long term (current) use of opiate analgesic: Secondary | ICD-10-CM | POA: Diagnosis not present

## 2021-05-23 DIAGNOSIS — M961 Postlaminectomy syndrome, not elsewhere classified: Secondary | ICD-10-CM | POA: Diagnosis not present

## 2021-05-23 DIAGNOSIS — M25522 Pain in left elbow: Secondary | ICD-10-CM | POA: Diagnosis not present

## 2021-05-23 DIAGNOSIS — G894 Chronic pain syndrome: Secondary | ICD-10-CM | POA: Diagnosis not present

## 2021-06-09 DIAGNOSIS — L89323 Pressure ulcer of left buttock, stage 3: Secondary | ICD-10-CM | POA: Diagnosis not present

## 2021-06-09 DIAGNOSIS — L89153 Pressure ulcer of sacral region, stage 3: Secondary | ICD-10-CM | POA: Diagnosis not present

## 2021-06-09 DIAGNOSIS — Z794 Long term (current) use of insulin: Secondary | ICD-10-CM | POA: Diagnosis not present

## 2021-06-09 DIAGNOSIS — E119 Type 2 diabetes mellitus without complications: Secondary | ICD-10-CM | POA: Diagnosis not present

## 2021-06-20 DIAGNOSIS — K7460 Unspecified cirrhosis of liver: Secondary | ICD-10-CM | POA: Diagnosis not present

## 2021-06-20 DIAGNOSIS — Z1231 Encounter for screening mammogram for malignant neoplasm of breast: Secondary | ICD-10-CM | POA: Diagnosis not present

## 2021-06-20 DIAGNOSIS — K760 Fatty (change of) liver, not elsewhere classified: Secondary | ICD-10-CM | POA: Diagnosis not present

## 2021-06-25 DIAGNOSIS — I10 Essential (primary) hypertension: Secondary | ICD-10-CM | POA: Diagnosis not present

## 2021-06-25 DIAGNOSIS — D519 Vitamin B12 deficiency anemia, unspecified: Secondary | ICD-10-CM | POA: Diagnosis not present

## 2021-06-25 DIAGNOSIS — K7460 Unspecified cirrhosis of liver: Secondary | ICD-10-CM | POA: Diagnosis not present

## 2021-06-25 DIAGNOSIS — E785 Hyperlipidemia, unspecified: Secondary | ICD-10-CM | POA: Diagnosis not present

## 2021-06-25 DIAGNOSIS — D696 Thrombocytopenia, unspecified: Secondary | ICD-10-CM | POA: Diagnosis not present

## 2021-06-27 DIAGNOSIS — Z79891 Long term (current) use of opiate analgesic: Secondary | ICD-10-CM | POA: Diagnosis not present

## 2021-06-27 DIAGNOSIS — G894 Chronic pain syndrome: Secondary | ICD-10-CM | POA: Diagnosis not present

## 2021-06-27 DIAGNOSIS — M25522 Pain in left elbow: Secondary | ICD-10-CM | POA: Diagnosis not present

## 2021-06-27 DIAGNOSIS — M961 Postlaminectomy syndrome, not elsewhere classified: Secondary | ICD-10-CM | POA: Diagnosis not present

## 2021-07-02 DIAGNOSIS — K589 Irritable bowel syndrome without diarrhea: Secondary | ICD-10-CM | POA: Diagnosis not present

## 2021-07-02 DIAGNOSIS — D500 Iron deficiency anemia secondary to blood loss (chronic): Secondary | ICD-10-CM | POA: Diagnosis not present

## 2021-07-02 DIAGNOSIS — K5000 Crohn's disease of small intestine without complications: Secondary | ICD-10-CM | POA: Diagnosis not present

## 2021-07-02 DIAGNOSIS — K7460 Unspecified cirrhosis of liver: Secondary | ICD-10-CM | POA: Diagnosis not present

## 2021-07-02 DIAGNOSIS — K2960 Other gastritis without bleeding: Secondary | ICD-10-CM | POA: Diagnosis not present

## 2021-07-02 DIAGNOSIS — K219 Gastro-esophageal reflux disease without esophagitis: Secondary | ICD-10-CM | POA: Diagnosis not present

## 2021-07-10 DIAGNOSIS — E119 Type 2 diabetes mellitus without complications: Secondary | ICD-10-CM | POA: Diagnosis not present

## 2021-07-10 DIAGNOSIS — L89323 Pressure ulcer of left buttock, stage 3: Secondary | ICD-10-CM | POA: Diagnosis not present

## 2021-07-10 DIAGNOSIS — Z794 Long term (current) use of insulin: Secondary | ICD-10-CM | POA: Diagnosis not present

## 2021-07-10 DIAGNOSIS — L89153 Pressure ulcer of sacral region, stage 3: Secondary | ICD-10-CM | POA: Diagnosis not present

## 2021-07-16 DIAGNOSIS — Z9181 History of falling: Secondary | ICD-10-CM | POA: Diagnosis not present

## 2021-07-16 DIAGNOSIS — Z0000 Encounter for general adult medical examination without abnormal findings: Secondary | ICD-10-CM | POA: Diagnosis not present

## 2021-07-16 DIAGNOSIS — E785 Hyperlipidemia, unspecified: Secondary | ICD-10-CM | POA: Diagnosis not present

## 2021-07-25 DIAGNOSIS — Z79891 Long term (current) use of opiate analgesic: Secondary | ICD-10-CM | POA: Diagnosis not present

## 2021-07-25 DIAGNOSIS — G894 Chronic pain syndrome: Secondary | ICD-10-CM | POA: Diagnosis not present

## 2021-07-25 DIAGNOSIS — M961 Postlaminectomy syndrome, not elsewhere classified: Secondary | ICD-10-CM | POA: Diagnosis not present

## 2021-07-25 DIAGNOSIS — M25522 Pain in left elbow: Secondary | ICD-10-CM | POA: Diagnosis not present

## 2021-08-09 DIAGNOSIS — L89153 Pressure ulcer of sacral region, stage 3: Secondary | ICD-10-CM | POA: Diagnosis not present

## 2021-08-09 DIAGNOSIS — Z794 Long term (current) use of insulin: Secondary | ICD-10-CM | POA: Diagnosis not present

## 2021-08-09 DIAGNOSIS — L89323 Pressure ulcer of left buttock, stage 3: Secondary | ICD-10-CM | POA: Diagnosis not present

## 2021-08-09 DIAGNOSIS — E119 Type 2 diabetes mellitus without complications: Secondary | ICD-10-CM | POA: Diagnosis not present

## 2021-08-22 DIAGNOSIS — M961 Postlaminectomy syndrome, not elsewhere classified: Secondary | ICD-10-CM | POA: Diagnosis not present

## 2021-08-22 DIAGNOSIS — Z79891 Long term (current) use of opiate analgesic: Secondary | ICD-10-CM | POA: Diagnosis not present

## 2021-08-22 DIAGNOSIS — G894 Chronic pain syndrome: Secondary | ICD-10-CM | POA: Diagnosis not present

## 2021-08-22 DIAGNOSIS — E1142 Type 2 diabetes mellitus with diabetic polyneuropathy: Secondary | ICD-10-CM | POA: Diagnosis not present

## 2021-09-09 DIAGNOSIS — Z794 Long term (current) use of insulin: Secondary | ICD-10-CM | POA: Diagnosis not present

## 2021-09-09 DIAGNOSIS — E119 Type 2 diabetes mellitus without complications: Secondary | ICD-10-CM | POA: Diagnosis not present

## 2021-09-09 DIAGNOSIS — L89323 Pressure ulcer of left buttock, stage 3: Secondary | ICD-10-CM | POA: Diagnosis not present

## 2021-09-09 DIAGNOSIS — L89153 Pressure ulcer of sacral region, stage 3: Secondary | ICD-10-CM | POA: Diagnosis not present

## 2021-09-19 DIAGNOSIS — M961 Postlaminectomy syndrome, not elsewhere classified: Secondary | ICD-10-CM | POA: Diagnosis not present

## 2021-09-19 DIAGNOSIS — E1142 Type 2 diabetes mellitus with diabetic polyneuropathy: Secondary | ICD-10-CM | POA: Diagnosis not present

## 2021-09-19 DIAGNOSIS — G894 Chronic pain syndrome: Secondary | ICD-10-CM | POA: Diagnosis not present

## 2021-09-19 DIAGNOSIS — Z79891 Long term (current) use of opiate analgesic: Secondary | ICD-10-CM | POA: Diagnosis not present

## 2021-09-28 DIAGNOSIS — Z20822 Contact with and (suspected) exposure to covid-19: Secondary | ICD-10-CM | POA: Diagnosis not present

## 2021-10-01 DIAGNOSIS — E1165 Type 2 diabetes mellitus with hyperglycemia: Secondary | ICD-10-CM | POA: Diagnosis not present

## 2021-10-01 DIAGNOSIS — E118 Type 2 diabetes mellitus with unspecified complications: Secondary | ICD-10-CM | POA: Diagnosis not present

## 2021-10-01 DIAGNOSIS — Z794 Long term (current) use of insulin: Secondary | ICD-10-CM | POA: Diagnosis not present

## 2021-10-08 DIAGNOSIS — L89323 Pressure ulcer of left buttock, stage 3: Secondary | ICD-10-CM | POA: Diagnosis not present

## 2021-10-08 DIAGNOSIS — Z794 Long term (current) use of insulin: Secondary | ICD-10-CM | POA: Diagnosis not present

## 2021-10-08 DIAGNOSIS — L89153 Pressure ulcer of sacral region, stage 3: Secondary | ICD-10-CM | POA: Diagnosis not present

## 2021-10-08 DIAGNOSIS — E119 Type 2 diabetes mellitus without complications: Secondary | ICD-10-CM | POA: Diagnosis not present

## 2021-10-25 DIAGNOSIS — G894 Chronic pain syndrome: Secondary | ICD-10-CM | POA: Diagnosis not present

## 2021-10-25 DIAGNOSIS — E1142 Type 2 diabetes mellitus with diabetic polyneuropathy: Secondary | ICD-10-CM | POA: Diagnosis not present

## 2021-10-25 DIAGNOSIS — Z79891 Long term (current) use of opiate analgesic: Secondary | ICD-10-CM | POA: Diagnosis not present

## 2021-10-25 DIAGNOSIS — M961 Postlaminectomy syndrome, not elsewhere classified: Secondary | ICD-10-CM | POA: Diagnosis not present

## 2021-10-28 DIAGNOSIS — Z20822 Contact with and (suspected) exposure to covid-19: Secondary | ICD-10-CM | POA: Diagnosis not present

## 2021-10-29 DIAGNOSIS — R300 Dysuria: Secondary | ICD-10-CM | POA: Diagnosis not present

## 2021-10-29 DIAGNOSIS — Z79899 Other long term (current) drug therapy: Secondary | ICD-10-CM | POA: Diagnosis not present

## 2021-10-29 DIAGNOSIS — K7460 Unspecified cirrhosis of liver: Secondary | ICD-10-CM | POA: Diagnosis not present

## 2021-10-29 DIAGNOSIS — D519 Vitamin B12 deficiency anemia, unspecified: Secondary | ICD-10-CM | POA: Diagnosis not present

## 2021-10-29 DIAGNOSIS — D696 Thrombocytopenia, unspecified: Secondary | ICD-10-CM | POA: Diagnosis not present

## 2021-10-29 DIAGNOSIS — D692 Other nonthrombocytopenic purpura: Secondary | ICD-10-CM | POA: Diagnosis not present

## 2021-10-29 DIAGNOSIS — E785 Hyperlipidemia, unspecified: Secondary | ICD-10-CM | POA: Diagnosis not present

## 2021-10-29 DIAGNOSIS — E1165 Type 2 diabetes mellitus with hyperglycemia: Secondary | ICD-10-CM | POA: Diagnosis not present

## 2021-10-29 DIAGNOSIS — I10 Essential (primary) hypertension: Secondary | ICD-10-CM | POA: Diagnosis not present

## 2021-10-29 DIAGNOSIS — Z1211 Encounter for screening for malignant neoplasm of colon: Secondary | ICD-10-CM | POA: Diagnosis not present

## 2021-11-08 DIAGNOSIS — L89153 Pressure ulcer of sacral region, stage 3: Secondary | ICD-10-CM | POA: Diagnosis not present

## 2021-11-08 DIAGNOSIS — E119 Type 2 diabetes mellitus without complications: Secondary | ICD-10-CM | POA: Diagnosis not present

## 2021-11-08 DIAGNOSIS — L89323 Pressure ulcer of left buttock, stage 3: Secondary | ICD-10-CM | POA: Diagnosis not present

## 2021-11-08 DIAGNOSIS — Z794 Long term (current) use of insulin: Secondary | ICD-10-CM | POA: Diagnosis not present

## 2021-11-25 DIAGNOSIS — M961 Postlaminectomy syndrome, not elsewhere classified: Secondary | ICD-10-CM | POA: Diagnosis not present

## 2021-11-25 DIAGNOSIS — Z79891 Long term (current) use of opiate analgesic: Secondary | ICD-10-CM | POA: Diagnosis not present

## 2021-11-25 DIAGNOSIS — G894 Chronic pain syndrome: Secondary | ICD-10-CM | POA: Diagnosis not present

## 2021-11-25 DIAGNOSIS — E1142 Type 2 diabetes mellitus with diabetic polyneuropathy: Secondary | ICD-10-CM | POA: Diagnosis not present

## 2021-12-09 DIAGNOSIS — L89153 Pressure ulcer of sacral region, stage 3: Secondary | ICD-10-CM | POA: Diagnosis not present

## 2021-12-09 DIAGNOSIS — Z794 Long term (current) use of insulin: Secondary | ICD-10-CM | POA: Diagnosis not present

## 2021-12-09 DIAGNOSIS — L89323 Pressure ulcer of left buttock, stage 3: Secondary | ICD-10-CM | POA: Diagnosis not present

## 2021-12-09 DIAGNOSIS — E119 Type 2 diabetes mellitus without complications: Secondary | ICD-10-CM | POA: Diagnosis not present

## 2021-12-23 DIAGNOSIS — M961 Postlaminectomy syndrome, not elsewhere classified: Secondary | ICD-10-CM | POA: Diagnosis not present

## 2021-12-23 DIAGNOSIS — G894 Chronic pain syndrome: Secondary | ICD-10-CM | POA: Diagnosis not present

## 2021-12-23 DIAGNOSIS — E1142 Type 2 diabetes mellitus with diabetic polyneuropathy: Secondary | ICD-10-CM | POA: Diagnosis not present

## 2021-12-23 DIAGNOSIS — Z79891 Long term (current) use of opiate analgesic: Secondary | ICD-10-CM | POA: Diagnosis not present

## 2022-01-07 DIAGNOSIS — L89153 Pressure ulcer of sacral region, stage 3: Secondary | ICD-10-CM | POA: Diagnosis not present

## 2022-01-07 DIAGNOSIS — L89323 Pressure ulcer of left buttock, stage 3: Secondary | ICD-10-CM | POA: Diagnosis not present

## 2022-01-07 DIAGNOSIS — Z794 Long term (current) use of insulin: Secondary | ICD-10-CM | POA: Diagnosis not present

## 2022-01-07 DIAGNOSIS — E119 Type 2 diabetes mellitus without complications: Secondary | ICD-10-CM | POA: Diagnosis not present

## 2022-02-04 DIAGNOSIS — Z794 Long term (current) use of insulin: Secondary | ICD-10-CM | POA: Diagnosis not present

## 2022-02-04 DIAGNOSIS — E119 Type 2 diabetes mellitus without complications: Secondary | ICD-10-CM | POA: Diagnosis not present

## 2022-02-06 DIAGNOSIS — G894 Chronic pain syndrome: Secondary | ICD-10-CM | POA: Diagnosis not present

## 2022-02-06 DIAGNOSIS — E119 Type 2 diabetes mellitus without complications: Secondary | ICD-10-CM | POA: Diagnosis not present

## 2022-02-06 DIAGNOSIS — E1142 Type 2 diabetes mellitus with diabetic polyneuropathy: Secondary | ICD-10-CM | POA: Diagnosis not present

## 2022-02-06 DIAGNOSIS — L89153 Pressure ulcer of sacral region, stage 3: Secondary | ICD-10-CM | POA: Diagnosis not present

## 2022-02-06 DIAGNOSIS — L89323 Pressure ulcer of left buttock, stage 3: Secondary | ICD-10-CM | POA: Diagnosis not present

## 2022-02-06 DIAGNOSIS — M961 Postlaminectomy syndrome, not elsewhere classified: Secondary | ICD-10-CM | POA: Diagnosis not present

## 2022-02-06 DIAGNOSIS — Z794 Long term (current) use of insulin: Secondary | ICD-10-CM | POA: Diagnosis not present

## 2022-02-06 DIAGNOSIS — Z79891 Long term (current) use of opiate analgesic: Secondary | ICD-10-CM | POA: Diagnosis not present

## 2022-02-26 DIAGNOSIS — D519 Vitamin B12 deficiency anemia, unspecified: Secondary | ICD-10-CM | POA: Diagnosis not present

## 2022-02-26 DIAGNOSIS — D692 Other nonthrombocytopenic purpura: Secondary | ICD-10-CM | POA: Diagnosis not present

## 2022-02-26 DIAGNOSIS — E1165 Type 2 diabetes mellitus with hyperglycemia: Secondary | ICD-10-CM | POA: Diagnosis not present

## 2022-02-26 DIAGNOSIS — K7460 Unspecified cirrhosis of liver: Secondary | ICD-10-CM | POA: Diagnosis not present

## 2022-02-26 DIAGNOSIS — E785 Hyperlipidemia, unspecified: Secondary | ICD-10-CM | POA: Diagnosis not present

## 2022-02-26 DIAGNOSIS — I10 Essential (primary) hypertension: Secondary | ICD-10-CM | POA: Diagnosis not present

## 2022-02-26 DIAGNOSIS — D696 Thrombocytopenia, unspecified: Secondary | ICD-10-CM | POA: Diagnosis not present

## 2022-03-06 DIAGNOSIS — Z79891 Long term (current) use of opiate analgesic: Secondary | ICD-10-CM | POA: Diagnosis not present

## 2022-03-06 DIAGNOSIS — M961 Postlaminectomy syndrome, not elsewhere classified: Secondary | ICD-10-CM | POA: Diagnosis not present

## 2022-03-06 DIAGNOSIS — G894 Chronic pain syndrome: Secondary | ICD-10-CM | POA: Diagnosis not present

## 2022-03-06 DIAGNOSIS — E1142 Type 2 diabetes mellitus with diabetic polyneuropathy: Secondary | ICD-10-CM | POA: Diagnosis not present

## 2022-03-08 DIAGNOSIS — L89323 Pressure ulcer of left buttock, stage 3: Secondary | ICD-10-CM | POA: Diagnosis not present

## 2022-03-08 DIAGNOSIS — Z794 Long term (current) use of insulin: Secondary | ICD-10-CM | POA: Diagnosis not present

## 2022-03-08 DIAGNOSIS — L89153 Pressure ulcer of sacral region, stage 3: Secondary | ICD-10-CM | POA: Diagnosis not present

## 2022-03-08 DIAGNOSIS — E119 Type 2 diabetes mellitus without complications: Secondary | ICD-10-CM | POA: Diagnosis not present

## 2022-04-02 DIAGNOSIS — N289 Disorder of kidney and ureter, unspecified: Secondary | ICD-10-CM | POA: Diagnosis not present

## 2022-04-04 DIAGNOSIS — E1142 Type 2 diabetes mellitus with diabetic polyneuropathy: Secondary | ICD-10-CM | POA: Diagnosis not present

## 2022-04-04 DIAGNOSIS — M961 Postlaminectomy syndrome, not elsewhere classified: Secondary | ICD-10-CM | POA: Diagnosis not present

## 2022-04-04 DIAGNOSIS — Z79891 Long term (current) use of opiate analgesic: Secondary | ICD-10-CM | POA: Diagnosis not present

## 2022-04-04 DIAGNOSIS — G894 Chronic pain syndrome: Secondary | ICD-10-CM | POA: Diagnosis not present

## 2022-04-08 DIAGNOSIS — E119 Type 2 diabetes mellitus without complications: Secondary | ICD-10-CM | POA: Diagnosis not present

## 2022-04-08 DIAGNOSIS — L89153 Pressure ulcer of sacral region, stage 3: Secondary | ICD-10-CM | POA: Diagnosis not present

## 2022-04-08 DIAGNOSIS — L89323 Pressure ulcer of left buttock, stage 3: Secondary | ICD-10-CM | POA: Diagnosis not present

## 2022-04-08 DIAGNOSIS — Z794 Long term (current) use of insulin: Secondary | ICD-10-CM | POA: Diagnosis not present

## 2022-05-02 DIAGNOSIS — G894 Chronic pain syndrome: Secondary | ICD-10-CM | POA: Diagnosis not present

## 2022-05-02 DIAGNOSIS — E1142 Type 2 diabetes mellitus with diabetic polyneuropathy: Secondary | ICD-10-CM | POA: Diagnosis not present

## 2022-05-02 DIAGNOSIS — Z79891 Long term (current) use of opiate analgesic: Secondary | ICD-10-CM | POA: Diagnosis not present

## 2022-05-02 DIAGNOSIS — M961 Postlaminectomy syndrome, not elsewhere classified: Secondary | ICD-10-CM | POA: Diagnosis not present

## 2022-05-08 DIAGNOSIS — L89323 Pressure ulcer of left buttock, stage 3: Secondary | ICD-10-CM | POA: Diagnosis not present

## 2022-05-08 DIAGNOSIS — L89153 Pressure ulcer of sacral region, stage 3: Secondary | ICD-10-CM | POA: Diagnosis not present

## 2022-05-08 DIAGNOSIS — Z794 Long term (current) use of insulin: Secondary | ICD-10-CM | POA: Diagnosis not present

## 2022-05-08 DIAGNOSIS — E119 Type 2 diabetes mellitus without complications: Secondary | ICD-10-CM | POA: Diagnosis not present

## 2022-05-28 DIAGNOSIS — E118 Type 2 diabetes mellitus with unspecified complications: Secondary | ICD-10-CM | POA: Diagnosis not present

## 2022-05-28 DIAGNOSIS — Z7984 Long term (current) use of oral hypoglycemic drugs: Secondary | ICD-10-CM | POA: Diagnosis not present

## 2022-05-28 DIAGNOSIS — Z794 Long term (current) use of insulin: Secondary | ICD-10-CM | POA: Diagnosis not present

## 2022-06-02 DIAGNOSIS — E1142 Type 2 diabetes mellitus with diabetic polyneuropathy: Secondary | ICD-10-CM | POA: Diagnosis not present

## 2022-06-02 DIAGNOSIS — G894 Chronic pain syndrome: Secondary | ICD-10-CM | POA: Diagnosis not present

## 2022-06-02 DIAGNOSIS — Z79891 Long term (current) use of opiate analgesic: Secondary | ICD-10-CM | POA: Diagnosis not present

## 2022-06-02 DIAGNOSIS — M961 Postlaminectomy syndrome, not elsewhere classified: Secondary | ICD-10-CM | POA: Diagnosis not present

## 2022-06-07 DIAGNOSIS — E119 Type 2 diabetes mellitus without complications: Secondary | ICD-10-CM | POA: Diagnosis not present

## 2022-06-07 DIAGNOSIS — L89153 Pressure ulcer of sacral region, stage 3: Secondary | ICD-10-CM | POA: Diagnosis not present

## 2022-06-07 DIAGNOSIS — L89323 Pressure ulcer of left buttock, stage 3: Secondary | ICD-10-CM | POA: Diagnosis not present

## 2022-06-07 DIAGNOSIS — Z794 Long term (current) use of insulin: Secondary | ICD-10-CM | POA: Diagnosis not present

## 2022-06-30 DIAGNOSIS — E1142 Type 2 diabetes mellitus with diabetic polyneuropathy: Secondary | ICD-10-CM | POA: Diagnosis not present

## 2022-06-30 DIAGNOSIS — Z79891 Long term (current) use of opiate analgesic: Secondary | ICD-10-CM | POA: Diagnosis not present

## 2022-06-30 DIAGNOSIS — M961 Postlaminectomy syndrome, not elsewhere classified: Secondary | ICD-10-CM | POA: Diagnosis not present

## 2022-06-30 DIAGNOSIS — G894 Chronic pain syndrome: Secondary | ICD-10-CM | POA: Diagnosis not present

## 2022-07-08 DIAGNOSIS — Z794 Long term (current) use of insulin: Secondary | ICD-10-CM | POA: Diagnosis not present

## 2022-07-08 DIAGNOSIS — E119 Type 2 diabetes mellitus without complications: Secondary | ICD-10-CM | POA: Diagnosis not present

## 2022-07-08 DIAGNOSIS — L89323 Pressure ulcer of left buttock, stage 3: Secondary | ICD-10-CM | POA: Diagnosis not present

## 2022-07-08 DIAGNOSIS — L89153 Pressure ulcer of sacral region, stage 3: Secondary | ICD-10-CM | POA: Diagnosis not present

## 2022-07-14 DIAGNOSIS — E1165 Type 2 diabetes mellitus with hyperglycemia: Secondary | ICD-10-CM | POA: Diagnosis not present

## 2022-07-14 DIAGNOSIS — D519 Vitamin B12 deficiency anemia, unspecified: Secondary | ICD-10-CM | POA: Diagnosis not present

## 2022-07-14 DIAGNOSIS — L4050 Arthropathic psoriasis, unspecified: Secondary | ICD-10-CM | POA: Diagnosis not present

## 2022-07-14 DIAGNOSIS — E785 Hyperlipidemia, unspecified: Secondary | ICD-10-CM | POA: Diagnosis not present

## 2022-07-14 DIAGNOSIS — K7460 Unspecified cirrhosis of liver: Secondary | ICD-10-CM | POA: Diagnosis not present

## 2022-07-14 DIAGNOSIS — I10 Essential (primary) hypertension: Secondary | ICD-10-CM | POA: Diagnosis not present

## 2022-07-25 DIAGNOSIS — Z23 Encounter for immunization: Secondary | ICD-10-CM | POA: Diagnosis not present

## 2022-07-28 DIAGNOSIS — M79642 Pain in left hand: Secondary | ICD-10-CM | POA: Diagnosis not present

## 2022-07-28 DIAGNOSIS — D696 Thrombocytopenia, unspecified: Secondary | ICD-10-CM | POA: Diagnosis not present

## 2022-07-28 DIAGNOSIS — M1990 Unspecified osteoarthritis, unspecified site: Secondary | ICD-10-CM | POA: Diagnosis not present

## 2022-07-28 DIAGNOSIS — L409 Psoriasis, unspecified: Secondary | ICD-10-CM | POA: Diagnosis not present

## 2022-07-28 DIAGNOSIS — E785 Hyperlipidemia, unspecified: Secondary | ICD-10-CM | POA: Diagnosis not present

## 2022-07-28 DIAGNOSIS — M25562 Pain in left knee: Secondary | ICD-10-CM | POA: Diagnosis not present

## 2022-07-28 DIAGNOSIS — Z79899 Other long term (current) drug therapy: Secondary | ICD-10-CM | POA: Diagnosis not present

## 2022-07-28 DIAGNOSIS — L4050 Arthropathic psoriasis, unspecified: Secondary | ICD-10-CM | POA: Diagnosis not present

## 2022-07-28 DIAGNOSIS — Z1159 Encounter for screening for other viral diseases: Secondary | ICD-10-CM | POA: Diagnosis not present

## 2022-07-28 DIAGNOSIS — M79641 Pain in right hand: Secondary | ICD-10-CM | POA: Diagnosis not present

## 2022-07-28 DIAGNOSIS — K7460 Unspecified cirrhosis of liver: Secondary | ICD-10-CM | POA: Diagnosis not present

## 2022-07-28 DIAGNOSIS — E1169 Type 2 diabetes mellitus with other specified complication: Secondary | ICD-10-CM | POA: Diagnosis not present

## 2022-07-28 DIAGNOSIS — M25561 Pain in right knee: Secondary | ICD-10-CM | POA: Diagnosis not present

## 2022-07-28 DIAGNOSIS — G8929 Other chronic pain: Secondary | ICD-10-CM | POA: Diagnosis not present

## 2022-07-29 DIAGNOSIS — M961 Postlaminectomy syndrome, not elsewhere classified: Secondary | ICD-10-CM | POA: Diagnosis not present

## 2022-07-29 DIAGNOSIS — Z79891 Long term (current) use of opiate analgesic: Secondary | ICD-10-CM | POA: Diagnosis not present

## 2022-07-29 DIAGNOSIS — G894 Chronic pain syndrome: Secondary | ICD-10-CM | POA: Diagnosis not present

## 2022-07-29 DIAGNOSIS — E1142 Type 2 diabetes mellitus with diabetic polyneuropathy: Secondary | ICD-10-CM | POA: Diagnosis not present

## 2022-08-07 DIAGNOSIS — L89323 Pressure ulcer of left buttock, stage 3: Secondary | ICD-10-CM | POA: Diagnosis not present

## 2022-08-07 DIAGNOSIS — L89153 Pressure ulcer of sacral region, stage 3: Secondary | ICD-10-CM | POA: Diagnosis not present

## 2022-08-07 DIAGNOSIS — E119 Type 2 diabetes mellitus without complications: Secondary | ICD-10-CM | POA: Diagnosis not present

## 2022-08-07 DIAGNOSIS — Z794 Long term (current) use of insulin: Secondary | ICD-10-CM | POA: Diagnosis not present

## 2022-08-19 DIAGNOSIS — I10 Essential (primary) hypertension: Secondary | ICD-10-CM | POA: Diagnosis not present

## 2022-08-19 DIAGNOSIS — E1165 Type 2 diabetes mellitus with hyperglycemia: Secondary | ICD-10-CM | POA: Diagnosis not present

## 2022-08-25 DIAGNOSIS — M1990 Unspecified osteoarthritis, unspecified site: Secondary | ICD-10-CM | POA: Diagnosis not present

## 2022-08-25 DIAGNOSIS — G8929 Other chronic pain: Secondary | ICD-10-CM | POA: Diagnosis not present

## 2022-08-25 DIAGNOSIS — K7460 Unspecified cirrhosis of liver: Secondary | ICD-10-CM | POA: Diagnosis not present

## 2022-08-25 DIAGNOSIS — L4050 Arthropathic psoriasis, unspecified: Secondary | ICD-10-CM | POA: Diagnosis not present

## 2022-08-25 DIAGNOSIS — D696 Thrombocytopenia, unspecified: Secondary | ICD-10-CM | POA: Diagnosis not present

## 2022-08-25 DIAGNOSIS — L409 Psoriasis, unspecified: Secondary | ICD-10-CM | POA: Diagnosis not present

## 2022-08-25 DIAGNOSIS — Z79899 Other long term (current) drug therapy: Secondary | ICD-10-CM | POA: Diagnosis not present

## 2022-08-25 DIAGNOSIS — E785 Hyperlipidemia, unspecified: Secondary | ICD-10-CM | POA: Diagnosis not present

## 2022-08-25 DIAGNOSIS — E1169 Type 2 diabetes mellitus with other specified complication: Secondary | ICD-10-CM | POA: Diagnosis not present

## 2022-08-26 DIAGNOSIS — M961 Postlaminectomy syndrome, not elsewhere classified: Secondary | ICD-10-CM | POA: Diagnosis not present

## 2022-08-26 DIAGNOSIS — Z79891 Long term (current) use of opiate analgesic: Secondary | ICD-10-CM | POA: Diagnosis not present

## 2022-08-26 DIAGNOSIS — E1142 Type 2 diabetes mellitus with diabetic polyneuropathy: Secondary | ICD-10-CM | POA: Diagnosis not present

## 2022-08-26 DIAGNOSIS — G894 Chronic pain syndrome: Secondary | ICD-10-CM | POA: Diagnosis not present

## 2022-09-06 DIAGNOSIS — Z794 Long term (current) use of insulin: Secondary | ICD-10-CM | POA: Diagnosis not present

## 2022-09-06 DIAGNOSIS — L89153 Pressure ulcer of sacral region, stage 3: Secondary | ICD-10-CM | POA: Diagnosis not present

## 2022-09-06 DIAGNOSIS — L89323 Pressure ulcer of left buttock, stage 3: Secondary | ICD-10-CM | POA: Diagnosis not present

## 2022-09-06 DIAGNOSIS — E119 Type 2 diabetes mellitus without complications: Secondary | ICD-10-CM | POA: Diagnosis not present

## 2022-09-24 DIAGNOSIS — E1142 Type 2 diabetes mellitus with diabetic polyneuropathy: Secondary | ICD-10-CM | POA: Diagnosis not present

## 2022-09-24 DIAGNOSIS — Z79891 Long term (current) use of opiate analgesic: Secondary | ICD-10-CM | POA: Diagnosis not present

## 2022-09-24 DIAGNOSIS — M961 Postlaminectomy syndrome, not elsewhere classified: Secondary | ICD-10-CM | POA: Diagnosis not present

## 2022-09-24 DIAGNOSIS — G894 Chronic pain syndrome: Secondary | ICD-10-CM | POA: Diagnosis not present

## 2022-09-29 DIAGNOSIS — K589 Irritable bowel syndrome without diarrhea: Secondary | ICD-10-CM | POA: Diagnosis not present

## 2022-09-29 DIAGNOSIS — K219 Gastro-esophageal reflux disease without esophagitis: Secondary | ICD-10-CM | POA: Diagnosis not present

## 2022-09-29 DIAGNOSIS — K2960 Other gastritis without bleeding: Secondary | ICD-10-CM | POA: Diagnosis not present

## 2022-09-29 DIAGNOSIS — K7460 Unspecified cirrhosis of liver: Secondary | ICD-10-CM | POA: Diagnosis not present

## 2022-09-29 DIAGNOSIS — D500 Iron deficiency anemia secondary to blood loss (chronic): Secondary | ICD-10-CM | POA: Diagnosis not present

## 2022-10-02 DIAGNOSIS — E119 Type 2 diabetes mellitus without complications: Secondary | ICD-10-CM | POA: Diagnosis not present

## 2022-10-02 DIAGNOSIS — Z794 Long term (current) use of insulin: Secondary | ICD-10-CM | POA: Diagnosis not present

## 2022-10-02 DIAGNOSIS — Z7984 Long term (current) use of oral hypoglycemic drugs: Secondary | ICD-10-CM | POA: Diagnosis not present

## 2022-10-03 DIAGNOSIS — K7460 Unspecified cirrhosis of liver: Secondary | ICD-10-CM | POA: Diagnosis not present

## 2022-10-06 DIAGNOSIS — Z1231 Encounter for screening mammogram for malignant neoplasm of breast: Secondary | ICD-10-CM | POA: Diagnosis not present

## 2022-10-07 DIAGNOSIS — E119 Type 2 diabetes mellitus without complications: Secondary | ICD-10-CM | POA: Diagnosis not present

## 2022-10-07 DIAGNOSIS — Z794 Long term (current) use of insulin: Secondary | ICD-10-CM | POA: Diagnosis not present

## 2022-10-07 DIAGNOSIS — L89153 Pressure ulcer of sacral region, stage 3: Secondary | ICD-10-CM | POA: Diagnosis not present

## 2022-10-07 DIAGNOSIS — L89323 Pressure ulcer of left buttock, stage 3: Secondary | ICD-10-CM | POA: Diagnosis not present

## 2022-10-08 DIAGNOSIS — E1169 Type 2 diabetes mellitus with other specified complication: Secondary | ICD-10-CM | POA: Diagnosis not present

## 2022-10-08 DIAGNOSIS — Z79899 Other long term (current) drug therapy: Secondary | ICD-10-CM | POA: Diagnosis not present

## 2022-10-08 DIAGNOSIS — L4050 Arthropathic psoriasis, unspecified: Secondary | ICD-10-CM | POA: Diagnosis not present

## 2022-10-08 DIAGNOSIS — M1990 Unspecified osteoarthritis, unspecified site: Secondary | ICD-10-CM | POA: Diagnosis not present

## 2022-10-08 DIAGNOSIS — K7460 Unspecified cirrhosis of liver: Secondary | ICD-10-CM | POA: Diagnosis not present

## 2022-10-08 DIAGNOSIS — G8929 Other chronic pain: Secondary | ICD-10-CM | POA: Diagnosis not present

## 2022-10-08 DIAGNOSIS — D696 Thrombocytopenia, unspecified: Secondary | ICD-10-CM | POA: Diagnosis not present

## 2022-10-08 DIAGNOSIS — E785 Hyperlipidemia, unspecified: Secondary | ICD-10-CM | POA: Diagnosis not present

## 2022-10-08 DIAGNOSIS — L409 Psoriasis, unspecified: Secondary | ICD-10-CM | POA: Diagnosis not present

## 2022-10-23 DIAGNOSIS — Z79891 Long term (current) use of opiate analgesic: Secondary | ICD-10-CM | POA: Diagnosis not present

## 2022-10-23 DIAGNOSIS — G894 Chronic pain syndrome: Secondary | ICD-10-CM | POA: Diagnosis not present

## 2022-10-23 DIAGNOSIS — E1142 Type 2 diabetes mellitus with diabetic polyneuropathy: Secondary | ICD-10-CM | POA: Diagnosis not present

## 2022-10-23 DIAGNOSIS — M961 Postlaminectomy syndrome, not elsewhere classified: Secondary | ICD-10-CM | POA: Diagnosis not present

## 2022-11-06 DIAGNOSIS — Z794 Long term (current) use of insulin: Secondary | ICD-10-CM | POA: Diagnosis not present

## 2022-11-06 DIAGNOSIS — E119 Type 2 diabetes mellitus without complications: Secondary | ICD-10-CM | POA: Diagnosis not present

## 2022-11-06 DIAGNOSIS — L89323 Pressure ulcer of left buttock, stage 3: Secondary | ICD-10-CM | POA: Diagnosis not present

## 2022-11-06 DIAGNOSIS — L89153 Pressure ulcer of sacral region, stage 3: Secondary | ICD-10-CM | POA: Diagnosis not present

## 2022-11-19 DIAGNOSIS — I10 Essential (primary) hypertension: Secondary | ICD-10-CM | POA: Diagnosis not present

## 2022-11-19 DIAGNOSIS — E1165 Type 2 diabetes mellitus with hyperglycemia: Secondary | ICD-10-CM | POA: Diagnosis not present

## 2022-11-20 DIAGNOSIS — E1142 Type 2 diabetes mellitus with diabetic polyneuropathy: Secondary | ICD-10-CM | POA: Diagnosis not present

## 2022-11-20 DIAGNOSIS — M961 Postlaminectomy syndrome, not elsewhere classified: Secondary | ICD-10-CM | POA: Diagnosis not present

## 2022-11-20 DIAGNOSIS — Z79891 Long term (current) use of opiate analgesic: Secondary | ICD-10-CM | POA: Diagnosis not present

## 2022-11-20 DIAGNOSIS — G894 Chronic pain syndrome: Secondary | ICD-10-CM | POA: Diagnosis not present

## 2022-11-26 DIAGNOSIS — D696 Thrombocytopenia, unspecified: Secondary | ICD-10-CM | POA: Diagnosis not present

## 2022-11-26 DIAGNOSIS — I10 Essential (primary) hypertension: Secondary | ICD-10-CM | POA: Diagnosis not present

## 2022-11-26 DIAGNOSIS — D692 Other nonthrombocytopenic purpura: Secondary | ICD-10-CM | POA: Diagnosis not present

## 2022-11-26 DIAGNOSIS — D519 Vitamin B12 deficiency anemia, unspecified: Secondary | ICD-10-CM | POA: Diagnosis not present

## 2022-11-26 DIAGNOSIS — K7460 Unspecified cirrhosis of liver: Secondary | ICD-10-CM | POA: Diagnosis not present

## 2022-11-26 DIAGNOSIS — E1165 Type 2 diabetes mellitus with hyperglycemia: Secondary | ICD-10-CM | POA: Diagnosis not present

## 2022-11-26 DIAGNOSIS — E785 Hyperlipidemia, unspecified: Secondary | ICD-10-CM | POA: Diagnosis not present

## 2022-12-06 DIAGNOSIS — Z794 Long term (current) use of insulin: Secondary | ICD-10-CM | POA: Diagnosis not present

## 2022-12-06 DIAGNOSIS — E119 Type 2 diabetes mellitus without complications: Secondary | ICD-10-CM | POA: Diagnosis not present

## 2022-12-06 DIAGNOSIS — L89153 Pressure ulcer of sacral region, stage 3: Secondary | ICD-10-CM | POA: Diagnosis not present

## 2022-12-06 DIAGNOSIS — L89323 Pressure ulcer of left buttock, stage 3: Secondary | ICD-10-CM | POA: Diagnosis not present

## 2022-12-10 DIAGNOSIS — E119 Type 2 diabetes mellitus without complications: Secondary | ICD-10-CM | POA: Diagnosis not present

## 2022-12-10 DIAGNOSIS — Z961 Presence of intraocular lens: Secondary | ICD-10-CM | POA: Diagnosis not present

## 2022-12-18 DIAGNOSIS — Z79891 Long term (current) use of opiate analgesic: Secondary | ICD-10-CM | POA: Diagnosis not present

## 2022-12-18 DIAGNOSIS — M961 Postlaminectomy syndrome, not elsewhere classified: Secondary | ICD-10-CM | POA: Diagnosis not present

## 2022-12-18 DIAGNOSIS — E1142 Type 2 diabetes mellitus with diabetic polyneuropathy: Secondary | ICD-10-CM | POA: Diagnosis not present

## 2022-12-18 DIAGNOSIS — G894 Chronic pain syndrome: Secondary | ICD-10-CM | POA: Diagnosis not present

## 2023-02-09 ENCOUNTER — Ambulatory Visit (INDEPENDENT_AMBULATORY_CARE_PROVIDER_SITE_OTHER): Payer: 59 | Admitting: Podiatry

## 2023-02-09 DIAGNOSIS — M79674 Pain in right toe(s): Secondary | ICD-10-CM | POA: Diagnosis not present

## 2023-02-09 DIAGNOSIS — M79675 Pain in left toe(s): Secondary | ICD-10-CM | POA: Diagnosis not present

## 2023-02-09 DIAGNOSIS — L84 Corns and callosities: Secondary | ICD-10-CM | POA: Diagnosis not present

## 2023-02-09 DIAGNOSIS — B351 Tinea unguium: Secondary | ICD-10-CM | POA: Diagnosis not present

## 2023-02-09 DIAGNOSIS — E1142 Type 2 diabetes mellitus with diabetic polyneuropathy: Secondary | ICD-10-CM

## 2023-02-09 NOTE — Progress Notes (Signed)
  Subjective:  Patient ID: [name], female    DOB: 1961/08/08,  MRN: [id number]  Chief Complaint  Patient presents with   Foot Problem    Patient nail coming off bilateral great hallux, callouses on bottom of feet     62 y.o. female presents with the above complaint. History confirmed with patient. Patient presenting with pain related to dystrophic thickened elongated nails. Patient is unable to trim own nails related to nail dystrophy and/or mobility issues. Patient does have a history of T2DM. Patient does have callus present located at the plantar 5th met head bilateral causing pain, also on the right hallux plantar IPJ.  Objective:  Physical Exam: warm, good capillary refill nail exam onychomycosis of the toenails, onycholysis, and dystrophic nails DP pulses palpable, PT pulses palpable, and protective sensation absent Left Foot:  Pain with palpation of nails due to elongation and dystrophic growth.  Hyperkeratotic lesion subfifth met head Right Foot: Pain with palpation of nails due to elongation and dystrophic growth.  However there is lesion subfifth met head as well as plantar aspect right hallux IPJ.  No underlying ulceration.  Assessment:   1. Pre-ulcerative calluses   2. Pain due to onychomycosis of toenails of both feet   3. DM type 2 with diabetic peripheral neuropathy      Plan:  Patient was evaluated and treated and all questions answered.  # DM2 with neuropathy Patient educated on diabetes. Discussed proper diabetic foot care and discussed risks and complications of disease. Educated patient in depth on reasons to return to the office immediately should he/she discover anything concerning or new on the feet. All questions answered. Discussed proper shoes as well.   #Hyperkeratotic lesions/pre ulcerative calluses present subfifth met head bilaterally as well as plantar aspect right hallux IPJ All symptomatic hyperkeratoses x 3 separate lesions were safely  debrided with a sterile #10 blade to patient's level of comfort without incident. We discussed preventative and palliative care of these lesions including supportive and accommodative shoegear, padding, prefabricated and custom molded accommodative orthoses, use of a pumice stone and lotions/creams daily.  #Onychomycosis with pain  -Nails palliatively debrided as below. -Educated on self-care  Procedure: Nail Debridement Rationale: Pain Type of Debridement: manual, sharp debridement. Instrumentation: Nail nipper, rotary burr. Number of Nails: 10  Return in about 3 months (around 05/11/2023) for [facility].         Corinna Gab, DPM [facility]

## 2023-02-11 ENCOUNTER — Other Ambulatory Visit (INDEPENDENT_AMBULATORY_CARE_PROVIDER_SITE_OTHER): Payer: 59 | Admitting: Podiatry

## 2023-02-11 ENCOUNTER — Telehealth: Payer: Self-pay | Admitting: *Deleted

## 2023-02-11 MED ORDER — UREA 10 % EX CREA: TOPICAL_CREAM | CUTANEOUS | 0 refills | Status: AC | PRN

## 2023-02-11 NOTE — Telephone Encounter (Signed)
Patient is calling for status of a foot cream that was supposed to be sent to pharmacy,not there, please advise.

## 2023-02-11 NOTE — Telephone Encounter (Signed)
Patient updated.

## 2023-02-11 NOTE — Progress Notes (Signed)
Order for urea cream sent

## 2023-03-07 DIAGNOSIS — Z794 Long term (current) use of insulin: Secondary | ICD-10-CM | POA: Diagnosis not present

## 2023-03-07 DIAGNOSIS — L89323 Pressure ulcer of left buttock, stage 3: Secondary | ICD-10-CM | POA: Diagnosis not present

## 2023-03-07 DIAGNOSIS — E119 Type 2 diabetes mellitus without complications: Secondary | ICD-10-CM | POA: Diagnosis not present

## 2023-03-07 DIAGNOSIS — L89153 Pressure ulcer of sacral region, stage 3: Secondary | ICD-10-CM | POA: Diagnosis not present

## 2023-03-12 DIAGNOSIS — E1142 Type 2 diabetes mellitus with diabetic polyneuropathy: Secondary | ICD-10-CM | POA: Diagnosis not present

## 2023-03-12 DIAGNOSIS — G894 Chronic pain syndrome: Secondary | ICD-10-CM | POA: Diagnosis not present

## 2023-03-12 DIAGNOSIS — Z79891 Long term (current) use of opiate analgesic: Secondary | ICD-10-CM | POA: Diagnosis not present

## 2023-03-12 DIAGNOSIS — M961 Postlaminectomy syndrome, not elsewhere classified: Secondary | ICD-10-CM | POA: Diagnosis not present

## 2023-03-26 DIAGNOSIS — D692 Other nonthrombocytopenic purpura: Secondary | ICD-10-CM | POA: Diagnosis not present

## 2023-03-26 DIAGNOSIS — D696 Thrombocytopenia, unspecified: Secondary | ICD-10-CM | POA: Diagnosis not present

## 2023-03-26 DIAGNOSIS — D519 Vitamin B12 deficiency anemia, unspecified: Secondary | ICD-10-CM | POA: Diagnosis not present

## 2023-03-26 DIAGNOSIS — K7460 Unspecified cirrhosis of liver: Secondary | ICD-10-CM | POA: Diagnosis not present

## 2023-03-26 DIAGNOSIS — I10 Essential (primary) hypertension: Secondary | ICD-10-CM | POA: Diagnosis not present

## 2023-03-26 DIAGNOSIS — E1165 Type 2 diabetes mellitus with hyperglycemia: Secondary | ICD-10-CM | POA: Diagnosis not present

## 2023-03-26 DIAGNOSIS — E785 Hyperlipidemia, unspecified: Secondary | ICD-10-CM | POA: Diagnosis not present

## 2023-04-09 DIAGNOSIS — G894 Chronic pain syndrome: Secondary | ICD-10-CM | POA: Diagnosis not present

## 2023-04-09 DIAGNOSIS — E1142 Type 2 diabetes mellitus with diabetic polyneuropathy: Secondary | ICD-10-CM | POA: Diagnosis not present

## 2023-04-09 DIAGNOSIS — Z79891 Long term (current) use of opiate analgesic: Secondary | ICD-10-CM | POA: Diagnosis not present

## 2023-04-09 DIAGNOSIS — M961 Postlaminectomy syndrome, not elsewhere classified: Secondary | ICD-10-CM | POA: Diagnosis not present

## 2023-04-10 DIAGNOSIS — L89153 Pressure ulcer of sacral region, stage 3: Secondary | ICD-10-CM | POA: Diagnosis not present

## 2023-04-10 DIAGNOSIS — E119 Type 2 diabetes mellitus without complications: Secondary | ICD-10-CM | POA: Diagnosis not present

## 2023-04-10 DIAGNOSIS — Z794 Long term (current) use of insulin: Secondary | ICD-10-CM | POA: Diagnosis not present

## 2023-04-10 DIAGNOSIS — L89323 Pressure ulcer of left buttock, stage 3: Secondary | ICD-10-CM | POA: Diagnosis not present

## 2023-04-14 DIAGNOSIS — E875 Hyperkalemia: Secondary | ICD-10-CM | POA: Diagnosis not present

## 2023-04-17 DIAGNOSIS — K7689 Other specified diseases of liver: Secondary | ICD-10-CM | POA: Diagnosis not present

## 2023-04-17 DIAGNOSIS — Z9049 Acquired absence of other specified parts of digestive tract: Secondary | ICD-10-CM | POA: Diagnosis not present

## 2023-04-17 DIAGNOSIS — K7460 Unspecified cirrhosis of liver: Secondary | ICD-10-CM | POA: Diagnosis not present

## 2023-05-06 DIAGNOSIS — D696 Thrombocytopenia, unspecified: Secondary | ICD-10-CM | POA: Diagnosis not present

## 2023-05-06 DIAGNOSIS — L4050 Arthropathic psoriasis, unspecified: Secondary | ICD-10-CM | POA: Diagnosis not present

## 2023-05-06 DIAGNOSIS — L409 Psoriasis, unspecified: Secondary | ICD-10-CM | POA: Diagnosis not present

## 2023-05-06 DIAGNOSIS — M1990 Unspecified osteoarthritis, unspecified site: Secondary | ICD-10-CM | POA: Diagnosis not present

## 2023-05-06 DIAGNOSIS — Z79899 Other long term (current) drug therapy: Secondary | ICD-10-CM | POA: Diagnosis not present

## 2023-05-06 DIAGNOSIS — G8929 Other chronic pain: Secondary | ICD-10-CM | POA: Diagnosis not present

## 2023-05-06 DIAGNOSIS — E1169 Type 2 diabetes mellitus with other specified complication: Secondary | ICD-10-CM | POA: Diagnosis not present

## 2023-05-06 DIAGNOSIS — K7460 Unspecified cirrhosis of liver: Secondary | ICD-10-CM | POA: Diagnosis not present

## 2023-05-06 DIAGNOSIS — E785 Hyperlipidemia, unspecified: Secondary | ICD-10-CM | POA: Diagnosis not present

## 2023-05-07 DIAGNOSIS — E1142 Type 2 diabetes mellitus with diabetic polyneuropathy: Secondary | ICD-10-CM | POA: Diagnosis not present

## 2023-05-07 DIAGNOSIS — G894 Chronic pain syndrome: Secondary | ICD-10-CM | POA: Diagnosis not present

## 2023-05-07 DIAGNOSIS — Z79891 Long term (current) use of opiate analgesic: Secondary | ICD-10-CM | POA: Diagnosis not present

## 2023-05-07 DIAGNOSIS — M961 Postlaminectomy syndrome, not elsewhere classified: Secondary | ICD-10-CM | POA: Diagnosis not present

## 2023-05-10 DIAGNOSIS — L89323 Pressure ulcer of left buttock, stage 3: Secondary | ICD-10-CM | POA: Diagnosis not present

## 2023-05-10 DIAGNOSIS — Z794 Long term (current) use of insulin: Secondary | ICD-10-CM | POA: Diagnosis not present

## 2023-05-10 DIAGNOSIS — E119 Type 2 diabetes mellitus without complications: Secondary | ICD-10-CM | POA: Diagnosis not present

## 2023-05-10 DIAGNOSIS — L89153 Pressure ulcer of sacral region, stage 3: Secondary | ICD-10-CM | POA: Diagnosis not present

## 2023-05-13 ENCOUNTER — Ambulatory Visit: Payer: 59 | Admitting: Podiatry

## 2023-05-13 DIAGNOSIS — K580 Irritable bowel syndrome with diarrhea: Secondary | ICD-10-CM | POA: Diagnosis not present

## 2023-05-13 DIAGNOSIS — D500 Iron deficiency anemia secondary to blood loss (chronic): Secondary | ICD-10-CM | POA: Diagnosis not present

## 2023-05-13 DIAGNOSIS — K219 Gastro-esophageal reflux disease without esophagitis: Secondary | ICD-10-CM | POA: Diagnosis not present

## 2023-05-13 DIAGNOSIS — K7460 Unspecified cirrhosis of liver: Secondary | ICD-10-CM | POA: Diagnosis not present

## 2023-05-18 DIAGNOSIS — E875 Hyperkalemia: Secondary | ICD-10-CM | POA: Diagnosis not present

## 2023-05-27 ENCOUNTER — Ambulatory Visit (INDEPENDENT_AMBULATORY_CARE_PROVIDER_SITE_OTHER): Payer: 59 | Admitting: Podiatry

## 2023-05-27 DIAGNOSIS — M79674 Pain in right toe(s): Secondary | ICD-10-CM | POA: Diagnosis not present

## 2023-05-27 DIAGNOSIS — L608 Other nail disorders: Secondary | ICD-10-CM | POA: Diagnosis not present

## 2023-05-27 DIAGNOSIS — B351 Tinea unguium: Secondary | ICD-10-CM | POA: Diagnosis not present

## 2023-05-27 DIAGNOSIS — M79675 Pain in left toe(s): Secondary | ICD-10-CM | POA: Diagnosis not present

## 2023-05-27 DIAGNOSIS — L603 Nail dystrophy: Secondary | ICD-10-CM

## 2023-05-27 NOTE — Progress Notes (Signed)
    Subjective:  Patient ID: [name], female    DOB: [birth year],  MRN: [id number]  [name] presents to clinic today for:  Chief Complaint  Patient presents with   Nail Problem    Left hallux nail fungus. Double nail.    Callouses    Callus trim    Patient notes nails are thick, discolored, elongated and painful in shoegear when trying to ambulate.  Patient has difficulty ambulating due to 3 prior back surgeries.  She uses a walker to assist with ambulation.  She does have a history of psoriasis.  She is looking to try something again to try to treat the bilateral hallux nails.    PCP is Jillian Floor., MD.  Allergies  Allergen Reactions   Aspirin Other (See Comments)    UNKNOWN   Nsaids Other (See Comments)   Omeprazole Itching and Other (See Comments)   Tetracycline Other (See Comments)   Review of Systems: Negative except as noted in the HPI.  Objective:  There were no vitals filed for this visit.  [name] is a pleasant 62 y.o. female in NAD. AAO x 3.  Vascular Examination: Capillary refill time is 3-5 seconds to toes bilateral. Palpable pedal pulses b/l LE. Digital hair present b/l. No pedal edema b/l. Skin temperature gradient WNL b/l. No varicosities b/l. No cyanosis or clubbing noted b/l.   Dermatological Examination: Pedal skin with normal turgor, texture and tone b/l. No open wounds. No interdigital macerations b/l. Toenails x10 are 3mm thick, discolored, dystrophic with subungual debris. There is pain with compression of the nail plates.  They are elongated x10  Assessment/Plan: 1. Pain due to onychomycosis of toenails of both feet   2. Nail dystrophy     The mycotic toenails were sharply debrided x10 with sterile nail nippers and a power debriding burr to decrease bulk/thickness and length.    Nail samples of the hallux nails were collected today and sent to the lab to confirm onychomycosis versus psoriatic nail disease.  Will  call the patient with results and get her started on treatment  Return in about 3 months (around 08/27/2023) for [facility].  Clerance Lav, DPM, FACFAS [facility]     [address] [place], [place] [address]                Office [phone]  Fax [phone]

## 2023-06-01 DIAGNOSIS — M961 Postlaminectomy syndrome, not elsewhere classified: Secondary | ICD-10-CM | POA: Diagnosis not present

## 2023-06-01 DIAGNOSIS — Z79891 Long term (current) use of opiate analgesic: Secondary | ICD-10-CM | POA: Diagnosis not present

## 2023-06-01 DIAGNOSIS — E1142 Type 2 diabetes mellitus with diabetic polyneuropathy: Secondary | ICD-10-CM | POA: Diagnosis not present

## 2023-06-01 DIAGNOSIS — G894 Chronic pain syndrome: Secondary | ICD-10-CM | POA: Diagnosis not present

## 2023-06-04 ENCOUNTER — Other Ambulatory Visit: Payer: Self-pay | Admitting: Podiatry

## 2023-06-04 DIAGNOSIS — L603 Nail dystrophy: Secondary | ICD-10-CM

## 2023-06-04 MED ORDER — UREA 45 % EX GEL: 1.0000 | Freq: Two times a day (BID) | CUTANEOUS | 2 refills | Status: AC

## 2023-06-04 NOTE — Progress Notes (Signed)
Reviewed patient's fungal nail culture report.  NEGATIVE for fungus.  Will send in Rx urea nail gel to apply to nails twice daily.

## 2023-06-09 DIAGNOSIS — E119 Type 2 diabetes mellitus without complications: Secondary | ICD-10-CM | POA: Diagnosis not present

## 2023-06-09 DIAGNOSIS — L89323 Pressure ulcer of left buttock, stage 3: Secondary | ICD-10-CM | POA: Diagnosis not present

## 2023-06-09 DIAGNOSIS — Z794 Long term (current) use of insulin: Secondary | ICD-10-CM | POA: Diagnosis not present

## 2023-06-09 DIAGNOSIS — L89153 Pressure ulcer of sacral region, stage 3: Secondary | ICD-10-CM | POA: Diagnosis not present

## 2023-07-02 DIAGNOSIS — G894 Chronic pain syndrome: Secondary | ICD-10-CM | POA: Diagnosis not present

## 2023-07-02 DIAGNOSIS — M961 Postlaminectomy syndrome, not elsewhere classified: Secondary | ICD-10-CM | POA: Diagnosis not present

## 2023-07-02 DIAGNOSIS — Z79891 Long term (current) use of opiate analgesic: Secondary | ICD-10-CM | POA: Diagnosis not present

## 2023-07-02 DIAGNOSIS — E1142 Type 2 diabetes mellitus with diabetic polyneuropathy: Secondary | ICD-10-CM | POA: Diagnosis not present

## 2023-07-21 DIAGNOSIS — E119 Type 2 diabetes mellitus without complications: Secondary | ICD-10-CM | POA: Diagnosis not present

## 2023-07-21 DIAGNOSIS — Z794 Long term (current) use of insulin: Secondary | ICD-10-CM | POA: Diagnosis not present

## 2023-07-31 DIAGNOSIS — E1142 Type 2 diabetes mellitus with diabetic polyneuropathy: Secondary | ICD-10-CM | POA: Diagnosis not present

## 2023-07-31 DIAGNOSIS — Z79891 Long term (current) use of opiate analgesic: Secondary | ICD-10-CM | POA: Diagnosis not present

## 2023-07-31 DIAGNOSIS — M961 Postlaminectomy syndrome, not elsewhere classified: Secondary | ICD-10-CM | POA: Diagnosis not present

## 2023-07-31 DIAGNOSIS — G894 Chronic pain syndrome: Secondary | ICD-10-CM | POA: Diagnosis not present

## 2023-08-20 DIAGNOSIS — Z794 Long term (current) use of insulin: Secondary | ICD-10-CM | POA: Diagnosis not present

## 2023-08-20 DIAGNOSIS — E119 Type 2 diabetes mellitus without complications: Secondary | ICD-10-CM | POA: Diagnosis not present

## 2023-08-27 ENCOUNTER — Ambulatory Visit (INDEPENDENT_AMBULATORY_CARE_PROVIDER_SITE_OTHER): Payer: 59 | Admitting: Podiatry

## 2023-08-27 DIAGNOSIS — M79674 Pain in right toe(s): Secondary | ICD-10-CM

## 2023-08-27 DIAGNOSIS — L84 Corns and callosities: Secondary | ICD-10-CM

## 2023-08-27 DIAGNOSIS — E1151 Type 2 diabetes mellitus with diabetic peripheral angiopathy without gangrene: Secondary | ICD-10-CM | POA: Diagnosis not present

## 2023-08-27 DIAGNOSIS — B351 Tinea unguium: Secondary | ICD-10-CM

## 2023-08-27 DIAGNOSIS — M79675 Pain in left toe(s): Secondary | ICD-10-CM

## 2023-08-27 NOTE — Progress Notes (Signed)
       Subjective:  Patient ID: [name], female    DOB: 10-20-1961,  MRN: [id number]  [name] presents to clinic today for:  Chief Complaint  Patient presents with   Foot Care    Last A1c: 7.1 she believes. No anticoag therapy, does have low platelets.    Patient notes nails are thick and elongated, causing pain in shoe gear when ambulating.  She has painful calluses bilateral submet 5 and bilateral submet 1  PCP is Wilmer Floor., MD. date last seen was 03/26/2023  No past medical history on file.  Allergies  Allergen Reactions   Aspirin Other (See Comments)    UNKNOWN   Nsaids Other (See Comments)   Omeprazole Itching and Other (See Comments)   Tetracycline Other (See Comments)    Objective:  AARILYN YAUGER is a pleasant 62 y.o. female in NAD. AAO x 3.  Vascular Examination: Patient has palpable DP pulse, absent PT pulse bilateral.  Delayed capillary refill bilateral toes.  Sparse digital hair bilateral.  Proximal to distal cooling WNL bilateral.    Dermatological Examination: Interspaces are clear with no open lesions noted bilateral.  Skin is shiny and atrophic bilateral.  Nails are 3-36mm thick, with yellowish/brown discoloration, subungual debris and distal onycholysis x10.  There is pain with compression of nails x10.  There are hyperkeratotic lesions noted bilateral submet 1, bilateral submet 5.  Patient qualifies for at-risk foot care because of diabetes with PVD.  Assessment/Plan: 1. Pain due to onychomycosis of toenails of both feet   2. Pre-ulcerative calluses   3. Type II diabetes mellitus with peripheral circulatory disorder (HCC)     Mycotic nails x10 were sharply debrided with sterile nail nippers and power debriding burr to decrease bulk and length.  Hyperkeratotic lesions x 4 were shaved with #312 blade.   Return in about 3 months (around 11/27/2023) for [facility]   Clerance Lav, DPM, FACFAS [facility]      [address] [place], [place] [address]                Office [phone]  Fax [phone]

## 2023-08-28 DIAGNOSIS — E1142 Type 2 diabetes mellitus with diabetic polyneuropathy: Secondary | ICD-10-CM | POA: Diagnosis not present

## 2023-08-28 DIAGNOSIS — G894 Chronic pain syndrome: Secondary | ICD-10-CM | POA: Diagnosis not present

## 2023-08-28 DIAGNOSIS — Z79891 Long term (current) use of opiate analgesic: Secondary | ICD-10-CM | POA: Diagnosis not present

## 2023-08-28 DIAGNOSIS — M961 Postlaminectomy syndrome, not elsewhere classified: Secondary | ICD-10-CM | POA: Diagnosis not present

## 2023-09-10 DIAGNOSIS — E785 Hyperlipidemia, unspecified: Secondary | ICD-10-CM | POA: Diagnosis not present

## 2023-09-10 DIAGNOSIS — Z79899 Other long term (current) drug therapy: Secondary | ICD-10-CM | POA: Diagnosis not present

## 2023-09-10 DIAGNOSIS — K7460 Unspecified cirrhosis of liver: Secondary | ICD-10-CM | POA: Diagnosis not present

## 2023-09-10 DIAGNOSIS — G8929 Other chronic pain: Secondary | ICD-10-CM | POA: Diagnosis not present

## 2023-09-10 DIAGNOSIS — Z23 Encounter for immunization: Secondary | ICD-10-CM | POA: Diagnosis not present

## 2023-09-10 DIAGNOSIS — E1169 Type 2 diabetes mellitus with other specified complication: Secondary | ICD-10-CM | POA: Diagnosis not present

## 2023-09-10 DIAGNOSIS — M1990 Unspecified osteoarthritis, unspecified site: Secondary | ICD-10-CM | POA: Diagnosis not present

## 2023-09-10 DIAGNOSIS — L409 Psoriasis, unspecified: Secondary | ICD-10-CM | POA: Diagnosis not present

## 2023-09-10 DIAGNOSIS — L4050 Arthropathic psoriasis, unspecified: Secondary | ICD-10-CM | POA: Diagnosis not present

## 2023-09-10 DIAGNOSIS — D696 Thrombocytopenia, unspecified: Secondary | ICD-10-CM | POA: Diagnosis not present

## 2023-09-19 DIAGNOSIS — E119 Type 2 diabetes mellitus without complications: Secondary | ICD-10-CM | POA: Diagnosis not present

## 2023-09-19 DIAGNOSIS — Z794 Long term (current) use of insulin: Secondary | ICD-10-CM | POA: Diagnosis not present

## 2023-09-22 DIAGNOSIS — G894 Chronic pain syndrome: Secondary | ICD-10-CM | POA: Diagnosis not present

## 2023-09-22 DIAGNOSIS — E1142 Type 2 diabetes mellitus with diabetic polyneuropathy: Secondary | ICD-10-CM | POA: Diagnosis not present

## 2023-09-22 DIAGNOSIS — M961 Postlaminectomy syndrome, not elsewhere classified: Secondary | ICD-10-CM | POA: Diagnosis not present

## 2023-09-22 DIAGNOSIS — Z79891 Long term (current) use of opiate analgesic: Secondary | ICD-10-CM | POA: Diagnosis not present

## 2023-09-30 DIAGNOSIS — Z794 Long term (current) use of insulin: Secondary | ICD-10-CM | POA: Diagnosis not present

## 2023-09-30 DIAGNOSIS — E1165 Type 2 diabetes mellitus with hyperglycemia: Secondary | ICD-10-CM | POA: Diagnosis not present

## 2023-09-30 DIAGNOSIS — Z978 Presence of other specified devices: Secondary | ICD-10-CM | POA: Diagnosis not present

## 2023-09-30 DIAGNOSIS — Z7984 Long term (current) use of oral hypoglycemic drugs: Secondary | ICD-10-CM | POA: Diagnosis not present

## 2023-10-06 DIAGNOSIS — D519 Vitamin B12 deficiency anemia, unspecified: Secondary | ICD-10-CM | POA: Diagnosis not present

## 2023-10-06 DIAGNOSIS — D692 Other nonthrombocytopenic purpura: Secondary | ICD-10-CM | POA: Diagnosis not present

## 2023-10-06 DIAGNOSIS — L4050 Arthropathic psoriasis, unspecified: Secondary | ICD-10-CM | POA: Diagnosis not present

## 2023-10-06 DIAGNOSIS — E1165 Type 2 diabetes mellitus with hyperglycemia: Secondary | ICD-10-CM | POA: Diagnosis not present

## 2023-10-06 DIAGNOSIS — E785 Hyperlipidemia, unspecified: Secondary | ICD-10-CM | POA: Diagnosis not present

## 2023-10-06 DIAGNOSIS — K7460 Unspecified cirrhosis of liver: Secondary | ICD-10-CM | POA: Diagnosis not present

## 2023-10-06 DIAGNOSIS — Z79899 Other long term (current) drug therapy: Secondary | ICD-10-CM | POA: Diagnosis not present

## 2023-10-06 DIAGNOSIS — D696 Thrombocytopenia, unspecified: Secondary | ICD-10-CM | POA: Diagnosis not present

## 2023-10-06 DIAGNOSIS — I10 Essential (primary) hypertension: Secondary | ICD-10-CM | POA: Diagnosis not present

## 2023-10-16 DIAGNOSIS — Z0000 Encounter for general adult medical examination without abnormal findings: Secondary | ICD-10-CM | POA: Diagnosis not present

## 2023-10-16 DIAGNOSIS — Z9181 History of falling: Secondary | ICD-10-CM | POA: Diagnosis not present

## 2023-11-02 DIAGNOSIS — Z794 Long term (current) use of insulin: Secondary | ICD-10-CM | POA: Diagnosis not present

## 2023-11-02 DIAGNOSIS — E119 Type 2 diabetes mellitus without complications: Secondary | ICD-10-CM | POA: Diagnosis not present

## 2023-11-18 DIAGNOSIS — E1142 Type 2 diabetes mellitus with diabetic polyneuropathy: Secondary | ICD-10-CM | POA: Diagnosis not present

## 2023-11-18 DIAGNOSIS — M961 Postlaminectomy syndrome, not elsewhere classified: Secondary | ICD-10-CM | POA: Diagnosis not present

## 2023-11-18 DIAGNOSIS — G894 Chronic pain syndrome: Secondary | ICD-10-CM | POA: Diagnosis not present

## 2023-11-18 DIAGNOSIS — Z79891 Long term (current) use of opiate analgesic: Secondary | ICD-10-CM | POA: Diagnosis not present

## 2023-12-02 ENCOUNTER — Ambulatory Visit (INDEPENDENT_AMBULATORY_CARE_PROVIDER_SITE_OTHER): Payer: 59 | Admitting: Podiatry

## 2023-12-02 ENCOUNTER — Telehealth: Payer: Self-pay

## 2023-12-02 DIAGNOSIS — M79674 Pain in right toe(s): Secondary | ICD-10-CM | POA: Diagnosis not present

## 2023-12-02 DIAGNOSIS — Z794 Long term (current) use of insulin: Secondary | ICD-10-CM | POA: Diagnosis not present

## 2023-12-02 DIAGNOSIS — L84 Corns and callosities: Secondary | ICD-10-CM | POA: Diagnosis not present

## 2023-12-02 DIAGNOSIS — B351 Tinea unguium: Secondary | ICD-10-CM

## 2023-12-02 DIAGNOSIS — M79675 Pain in left toe(s): Secondary | ICD-10-CM

## 2023-12-02 DIAGNOSIS — E1151 Type 2 diabetes mellitus with diabetic peripheral angiopathy without gangrene: Secondary | ICD-10-CM | POA: Diagnosis not present

## 2023-12-02 DIAGNOSIS — E119 Type 2 diabetes mellitus without complications: Secondary | ICD-10-CM | POA: Diagnosis not present

## 2023-12-02 MED ORDER — JUBLIA 10 % EX SOLN: 2.0000 [drp] | Freq: Every day | CUTANEOUS | 4 refills | Status: DC

## 2023-12-02 MED ORDER — CICLOPIROX 8 % EX SOLN: Freq: Every day | CUTANEOUS | 11 refills | Status: AC

## 2023-12-02 NOTE — Telephone Encounter (Signed)
Morrie Sheldon with [facility] pharmacy called stating they do not carry Jublia. She stated that they do carry ciclopirox if you would like patient to have that.   [phone]

## 2023-12-02 NOTE — Progress Notes (Unsigned)
Subjective:  Patient ID: [name], female    DOB: [birth year],  MRN: [id number]  [name] presents to clinic today for:  Chief Complaint  Patient presents with   [facility]    [facility] with callous. Last A1c was 8.2 one month ago, no anticoags.    Patient notes nails are thick and elongated, causing pain in shoe gear when ambulating.  She has painful calluses right submet 1 and bilateral submet 5.  Patient would like someone to go over her fungal nail culture results.  As she thinks they might have been done at her last visit.  Upon review of her records this was performed last August and the results had not been flagged for my review.  Endocrinologist is Dr. March Rummage.  Date last seen was 09/30/2023.  Patient's last A1c was 8.1 on 09/30/2023  History reviewed. No pertinent past medical history.  Allergies  Allergen Reactions   Aspirin Other (See Comments)    UNKNOWN   Nsaids Other (See Comments)   Omeprazole Itching and Other (See Comments)   Tetracycline Other (See Comments)    Objective:  [name] is a pleasant 63 y.o. female in NAD. AAO x 3.  Vascular Examination: Patient has palpable DP pulse, absent PT pulse bilateral.  Delayed capillary refill bilateral toes.  Sparse digital hair bilateral.  Proximal to distal cooling WNL bilateral.    Dermatological Examination: Interspaces are clear with no open lesions noted bilateral.  Skin is shiny and atrophic bilateral.  Nails are 3-74mm thick, with yellowish/brown discoloration, subungual debris and distal onycholysis x10.  There is pain with compression of nails x10.  There are hyperkeratotic lesions noted right submet 1 and bilateral submet 5.     Patient qualifies for at-risk foot care because of diabetes with PVD.  Assessment/Plan: 1. Pain due to onychomycosis of toenails of both feet   2. Pre-ulcerative calluses   3. Type II diabetes mellitus with peripheral circulatory disorder (HCC)     Meds  ordered this encounter  Medications   DISCONTD: Efinaconazole (JUBLIA) 10 % SOLN    Sig: Apply 2 drops topically daily.    Dispense:  4 mL    Refill:  4   ciclopirox (PENLAC) 8 % solution    Sig: Apply topically at bedtime. Apply thin coating over nail . Apply daily over previous coat. Remove weekly with polish remover.    Dispense:  6.6 mL    Refill:  11   Mycotic nails x10 were sharply debrided with sterile nail nippers and power debriding burr to decrease bulk and length.  Hyperkeratotic lesions x 3 were shaved with #312 blade.  The exam chair does not recline back very far to allow for better visualization to the plantar surface of both feet (it hits the wall when tilting).  This ,combined with the inability of the patient to hold her foot straight up, makes it very difficult to see the calluses when shaving.  The right fifth met callus was shaved into healthy skin slightly.  There was some bleeding noted.  Hemostasis was obtained.  Antibiotic ointment and a Band-Aid were applied.  Patient will keep an eye on the area and call if any signs of infection develop.  She does have a family member at home who can see her foot and we will look at her foot daily.  I will need to speak to our office administrator regarding our exam chair being in a position  where we cannot tilt the chairs adequately enough to have proper visualization to perform care, with no ability to raise the chair and no light to illuminate shadowy areas while working.   Reviewed the fungal culture results revealing a saprophytic mold.  She would be best served with a prescription of Tavaborole solution.  This was sent to her pharmacy.  We were notified this is not on her insurance formulary.  Therefore we sent in Jublia since this one was on her formulary and has a very high success rate.  We received notification from the pharmacy that they do not have this in stock and recommended that we send in ciclopirox.  This typically has the  least amount of improvement of the three FDA approved nail antifungal topical medications, but this was sent in to the patient's pharmacy instead.   Return in about 3 months (around 02/29/2024) for [facility].   Clerance Lav, DPM, FACFAS [facility]     [address] [place], [place] [address]                Office [phone]  Fax [phone]

## 2023-12-04 ENCOUNTER — Encounter: Payer: Self-pay | Admitting: Podiatry

## 2023-12-14 DIAGNOSIS — E119 Type 2 diabetes mellitus without complications: Secondary | ICD-10-CM | POA: Diagnosis not present

## 2023-12-16 DIAGNOSIS — G894 Chronic pain syndrome: Secondary | ICD-10-CM | POA: Diagnosis not present

## 2023-12-16 DIAGNOSIS — Z79891 Long term (current) use of opiate analgesic: Secondary | ICD-10-CM | POA: Diagnosis not present

## 2023-12-16 DIAGNOSIS — E1142 Type 2 diabetes mellitus with diabetic polyneuropathy: Secondary | ICD-10-CM | POA: Diagnosis not present

## 2023-12-16 DIAGNOSIS — M961 Postlaminectomy syndrome, not elsewhere classified: Secondary | ICD-10-CM | POA: Diagnosis not present

## 2024-01-01 DIAGNOSIS — E119 Type 2 diabetes mellitus without complications: Secondary | ICD-10-CM | POA: Diagnosis not present

## 2024-01-01 DIAGNOSIS — Z794 Long term (current) use of insulin: Secondary | ICD-10-CM | POA: Diagnosis not present

## 2024-01-04 DIAGNOSIS — G8929 Other chronic pain: Secondary | ICD-10-CM | POA: Diagnosis not present

## 2024-01-04 DIAGNOSIS — K7460 Unspecified cirrhosis of liver: Secondary | ICD-10-CM | POA: Diagnosis not present

## 2024-01-04 DIAGNOSIS — E785 Hyperlipidemia, unspecified: Secondary | ICD-10-CM | POA: Diagnosis not present

## 2024-01-04 DIAGNOSIS — D696 Thrombocytopenia, unspecified: Secondary | ICD-10-CM | POA: Diagnosis not present

## 2024-01-04 DIAGNOSIS — L409 Psoriasis, unspecified: Secondary | ICD-10-CM | POA: Diagnosis not present

## 2024-01-04 DIAGNOSIS — Z79899 Other long term (current) drug therapy: Secondary | ICD-10-CM | POA: Diagnosis not present

## 2024-01-04 DIAGNOSIS — M1990 Unspecified osteoarthritis, unspecified site: Secondary | ICD-10-CM | POA: Diagnosis not present

## 2024-01-04 DIAGNOSIS — L4050 Arthropathic psoriasis, unspecified: Secondary | ICD-10-CM | POA: Diagnosis not present

## 2024-01-04 DIAGNOSIS — E1169 Type 2 diabetes mellitus with other specified complication: Secondary | ICD-10-CM | POA: Diagnosis not present

## 2024-01-21 DIAGNOSIS — Z79891 Long term (current) use of opiate analgesic: Secondary | ICD-10-CM | POA: Diagnosis not present

## 2024-01-21 DIAGNOSIS — E1142 Type 2 diabetes mellitus with diabetic polyneuropathy: Secondary | ICD-10-CM | POA: Diagnosis not present

## 2024-01-21 DIAGNOSIS — G894 Chronic pain syndrome: Secondary | ICD-10-CM | POA: Diagnosis not present

## 2024-01-21 DIAGNOSIS — M961 Postlaminectomy syndrome, not elsewhere classified: Secondary | ICD-10-CM | POA: Diagnosis not present

## 2024-01-25 DIAGNOSIS — Z794 Long term (current) use of insulin: Secondary | ICD-10-CM | POA: Diagnosis not present

## 2024-01-25 DIAGNOSIS — E119 Type 2 diabetes mellitus without complications: Secondary | ICD-10-CM | POA: Diagnosis not present

## 2024-01-28 DIAGNOSIS — R195 Other fecal abnormalities: Secondary | ICD-10-CM | POA: Diagnosis not present

## 2024-01-28 DIAGNOSIS — E119 Type 2 diabetes mellitus without complications: Secondary | ICD-10-CM | POA: Diagnosis not present

## 2024-01-28 DIAGNOSIS — K5000 Crohn's disease of small intestine without complications: Secondary | ICD-10-CM | POA: Diagnosis not present

## 2024-01-28 DIAGNOSIS — K219 Gastro-esophageal reflux disease without esophagitis: Secondary | ICD-10-CM | POA: Diagnosis not present

## 2024-01-28 DIAGNOSIS — K7460 Unspecified cirrhosis of liver: Secondary | ICD-10-CM | POA: Diagnosis not present

## 2024-01-28 DIAGNOSIS — K649 Unspecified hemorrhoids: Secondary | ICD-10-CM | POA: Diagnosis not present

## 2024-01-29 DIAGNOSIS — E118 Type 2 diabetes mellitus with unspecified complications: Secondary | ICD-10-CM | POA: Diagnosis not present

## 2024-01-29 DIAGNOSIS — Z794 Long term (current) use of insulin: Secondary | ICD-10-CM | POA: Diagnosis not present

## 2024-02-01 DIAGNOSIS — N281 Cyst of kidney, acquired: Secondary | ICD-10-CM | POA: Diagnosis not present

## 2024-02-01 DIAGNOSIS — K7460 Unspecified cirrhosis of liver: Secondary | ICD-10-CM | POA: Diagnosis not present

## 2024-02-02 DIAGNOSIS — D696 Thrombocytopenia, unspecified: Secondary | ICD-10-CM | POA: Diagnosis not present

## 2024-02-02 DIAGNOSIS — E785 Hyperlipidemia, unspecified: Secondary | ICD-10-CM | POA: Diagnosis not present

## 2024-02-02 DIAGNOSIS — L4050 Arthropathic psoriasis, unspecified: Secondary | ICD-10-CM | POA: Diagnosis not present

## 2024-02-02 DIAGNOSIS — I10 Essential (primary) hypertension: Secondary | ICD-10-CM | POA: Diagnosis not present

## 2024-02-02 DIAGNOSIS — K7460 Unspecified cirrhosis of liver: Secondary | ICD-10-CM | POA: Diagnosis not present

## 2024-02-02 DIAGNOSIS — D692 Other nonthrombocytopenic purpura: Secondary | ICD-10-CM | POA: Diagnosis not present

## 2024-02-02 DIAGNOSIS — E1165 Type 2 diabetes mellitus with hyperglycemia: Secondary | ICD-10-CM | POA: Diagnosis not present

## 2024-02-17 DIAGNOSIS — E785 Hyperlipidemia, unspecified: Secondary | ICD-10-CM | POA: Diagnosis not present

## 2024-02-17 DIAGNOSIS — E1165 Type 2 diabetes mellitus with hyperglycemia: Secondary | ICD-10-CM | POA: Diagnosis not present

## 2024-02-17 DIAGNOSIS — I10 Essential (primary) hypertension: Secondary | ICD-10-CM | POA: Diagnosis not present

## 2024-02-22 DIAGNOSIS — M961 Postlaminectomy syndrome, not elsewhere classified: Secondary | ICD-10-CM | POA: Diagnosis not present

## 2024-02-22 DIAGNOSIS — Z79891 Long term (current) use of opiate analgesic: Secondary | ICD-10-CM | POA: Diagnosis not present

## 2024-02-22 DIAGNOSIS — G894 Chronic pain syndrome: Secondary | ICD-10-CM | POA: Diagnosis not present

## 2024-02-22 DIAGNOSIS — E1142 Type 2 diabetes mellitus with diabetic polyneuropathy: Secondary | ICD-10-CM | POA: Diagnosis not present

## 2024-02-24 DIAGNOSIS — E119 Type 2 diabetes mellitus without complications: Secondary | ICD-10-CM | POA: Diagnosis not present

## 2024-02-24 DIAGNOSIS — Z794 Long term (current) use of insulin: Secondary | ICD-10-CM | POA: Diagnosis not present

## 2024-03-09 ENCOUNTER — Ambulatory Visit: Payer: 59 | Admitting: Podiatry

## 2024-03-19 DIAGNOSIS — I10 Essential (primary) hypertension: Secondary | ICD-10-CM | POA: Diagnosis not present

## 2024-03-19 DIAGNOSIS — E785 Hyperlipidemia, unspecified: Secondary | ICD-10-CM | POA: Diagnosis not present

## 2024-03-19 DIAGNOSIS — E1165 Type 2 diabetes mellitus with hyperglycemia: Secondary | ICD-10-CM | POA: Diagnosis not present

## 2024-03-22 DIAGNOSIS — G894 Chronic pain syndrome: Secondary | ICD-10-CM | POA: Diagnosis not present

## 2024-03-22 DIAGNOSIS — E1142 Type 2 diabetes mellitus with diabetic polyneuropathy: Secondary | ICD-10-CM | POA: Diagnosis not present

## 2024-03-22 DIAGNOSIS — Z79891 Long term (current) use of opiate analgesic: Secondary | ICD-10-CM | POA: Diagnosis not present

## 2024-03-22 DIAGNOSIS — M961 Postlaminectomy syndrome, not elsewhere classified: Secondary | ICD-10-CM | POA: Diagnosis not present

## 2024-03-25 DIAGNOSIS — E119 Type 2 diabetes mellitus without complications: Secondary | ICD-10-CM | POA: Diagnosis not present

## 2024-03-25 DIAGNOSIS — Z794 Long term (current) use of insulin: Secondary | ICD-10-CM | POA: Diagnosis not present

## 2024-04-18 DIAGNOSIS — E1165 Type 2 diabetes mellitus with hyperglycemia: Secondary | ICD-10-CM | POA: Diagnosis not present

## 2024-04-18 DIAGNOSIS — I10 Essential (primary) hypertension: Secondary | ICD-10-CM | POA: Diagnosis not present

## 2024-04-20 DIAGNOSIS — G894 Chronic pain syndrome: Secondary | ICD-10-CM | POA: Diagnosis not present

## 2024-04-20 DIAGNOSIS — M961 Postlaminectomy syndrome, not elsewhere classified: Secondary | ICD-10-CM | POA: Diagnosis not present

## 2024-04-20 DIAGNOSIS — E1142 Type 2 diabetes mellitus with diabetic polyneuropathy: Secondary | ICD-10-CM | POA: Diagnosis not present

## 2024-04-20 DIAGNOSIS — Z79891 Long term (current) use of opiate analgesic: Secondary | ICD-10-CM | POA: Diagnosis not present

## 2024-04-21 DIAGNOSIS — Z79891 Long term (current) use of opiate analgesic: Secondary | ICD-10-CM | POA: Diagnosis not present

## 2024-04-21 DIAGNOSIS — G894 Chronic pain syndrome: Secondary | ICD-10-CM | POA: Diagnosis not present

## 2024-05-18 DIAGNOSIS — Z79891 Long term (current) use of opiate analgesic: Secondary | ICD-10-CM | POA: Diagnosis not present

## 2024-05-18 DIAGNOSIS — E1142 Type 2 diabetes mellitus with diabetic polyneuropathy: Secondary | ICD-10-CM | POA: Diagnosis not present

## 2024-05-18 DIAGNOSIS — G894 Chronic pain syndrome: Secondary | ICD-10-CM | POA: Diagnosis not present

## 2024-05-18 DIAGNOSIS — M961 Postlaminectomy syndrome, not elsewhere classified: Secondary | ICD-10-CM | POA: Diagnosis not present

## 2024-05-19 DIAGNOSIS — I10 Essential (primary) hypertension: Secondary | ICD-10-CM | POA: Diagnosis not present

## 2024-05-19 DIAGNOSIS — E1165 Type 2 diabetes mellitus with hyperglycemia: Secondary | ICD-10-CM | POA: Diagnosis not present

## 2024-05-23 DIAGNOSIS — Z794 Long term (current) use of insulin: Secondary | ICD-10-CM | POA: Diagnosis not present

## 2024-05-23 DIAGNOSIS — E119 Type 2 diabetes mellitus without complications: Secondary | ICD-10-CM | POA: Diagnosis not present

## 2024-05-31 DIAGNOSIS — E118 Type 2 diabetes mellitus with unspecified complications: Secondary | ICD-10-CM | POA: Diagnosis not present

## 2024-05-31 DIAGNOSIS — Z794 Long term (current) use of insulin: Secondary | ICD-10-CM | POA: Diagnosis not present

## 2024-06-01 DIAGNOSIS — K7460 Unspecified cirrhosis of liver: Secondary | ICD-10-CM | POA: Diagnosis not present

## 2024-06-01 DIAGNOSIS — I10 Essential (primary) hypertension: Secondary | ICD-10-CM | POA: Diagnosis not present

## 2024-06-01 DIAGNOSIS — E1165 Type 2 diabetes mellitus with hyperglycemia: Secondary | ICD-10-CM | POA: Diagnosis not present

## 2024-06-01 DIAGNOSIS — L4050 Arthropathic psoriasis, unspecified: Secondary | ICD-10-CM | POA: Diagnosis not present

## 2024-06-01 DIAGNOSIS — D692 Other nonthrombocytopenic purpura: Secondary | ICD-10-CM | POA: Diagnosis not present

## 2024-06-01 DIAGNOSIS — D696 Thrombocytopenia, unspecified: Secondary | ICD-10-CM | POA: Diagnosis not present

## 2024-06-01 DIAGNOSIS — E785 Hyperlipidemia, unspecified: Secondary | ICD-10-CM | POA: Diagnosis not present

## 2024-06-15 DIAGNOSIS — Z79891 Long term (current) use of opiate analgesic: Secondary | ICD-10-CM | POA: Diagnosis not present

## 2024-06-15 DIAGNOSIS — E1142 Type 2 diabetes mellitus with diabetic polyneuropathy: Secondary | ICD-10-CM | POA: Diagnosis not present

## 2024-06-15 DIAGNOSIS — G894 Chronic pain syndrome: Secondary | ICD-10-CM | POA: Diagnosis not present

## 2024-06-15 DIAGNOSIS — M961 Postlaminectomy syndrome, not elsewhere classified: Secondary | ICD-10-CM | POA: Diagnosis not present

## 2024-06-19 DIAGNOSIS — E1165 Type 2 diabetes mellitus with hyperglycemia: Secondary | ICD-10-CM | POA: Diagnosis not present

## 2024-06-19 DIAGNOSIS — I10 Essential (primary) hypertension: Secondary | ICD-10-CM | POA: Diagnosis not present

## 2024-06-22 DIAGNOSIS — E119 Type 2 diabetes mellitus without complications: Secondary | ICD-10-CM | POA: Diagnosis not present

## 2024-06-22 DIAGNOSIS — Z794 Long term (current) use of insulin: Secondary | ICD-10-CM | POA: Diagnosis not present

## 2024-07-05 DIAGNOSIS — N183 Chronic kidney disease, stage 3 unspecified: Secondary | ICD-10-CM | POA: Diagnosis not present

## 2024-07-07 DIAGNOSIS — I10 Essential (primary) hypertension: Secondary | ICD-10-CM | POA: Diagnosis not present

## 2024-07-07 DIAGNOSIS — E875 Hyperkalemia: Secondary | ICD-10-CM | POA: Diagnosis not present

## 2024-07-07 DIAGNOSIS — Z794 Long term (current) use of insulin: Secondary | ICD-10-CM | POA: Diagnosis not present

## 2024-07-07 DIAGNOSIS — Z79899 Other long term (current) drug therapy: Secondary | ICD-10-CM | POA: Diagnosis not present

## 2024-07-12 DIAGNOSIS — E875 Hyperkalemia: Secondary | ICD-10-CM | POA: Diagnosis not present

## 2024-07-14 DIAGNOSIS — Z79891 Long term (current) use of opiate analgesic: Secondary | ICD-10-CM | POA: Diagnosis not present

## 2024-07-14 DIAGNOSIS — E1142 Type 2 diabetes mellitus with diabetic polyneuropathy: Secondary | ICD-10-CM | POA: Diagnosis not present

## 2024-07-14 DIAGNOSIS — M961 Postlaminectomy syndrome, not elsewhere classified: Secondary | ICD-10-CM | POA: Diagnosis not present

## 2024-07-14 DIAGNOSIS — G894 Chronic pain syndrome: Secondary | ICD-10-CM | POA: Diagnosis not present

## 2024-07-19 DIAGNOSIS — I10 Essential (primary) hypertension: Secondary | ICD-10-CM | POA: Diagnosis not present

## 2024-07-19 DIAGNOSIS — E1165 Type 2 diabetes mellitus with hyperglycemia: Secondary | ICD-10-CM | POA: Diagnosis not present

## 2024-07-22 DIAGNOSIS — Z794 Long term (current) use of insulin: Secondary | ICD-10-CM | POA: Diagnosis not present

## 2024-07-22 DIAGNOSIS — E119 Type 2 diabetes mellitus without complications: Secondary | ICD-10-CM | POA: Diagnosis not present

## 2024-07-26 DIAGNOSIS — Z23 Encounter for immunization: Secondary | ICD-10-CM | POA: Diagnosis not present

## 2024-07-26 DIAGNOSIS — E875 Hyperkalemia: Secondary | ICD-10-CM | POA: Diagnosis not present

## 2024-08-11 DIAGNOSIS — Z79891 Long term (current) use of opiate analgesic: Secondary | ICD-10-CM | POA: Diagnosis not present

## 2024-08-11 DIAGNOSIS — G894 Chronic pain syndrome: Secondary | ICD-10-CM | POA: Diagnosis not present

## 2024-08-11 DIAGNOSIS — M961 Postlaminectomy syndrome, not elsewhere classified: Secondary | ICD-10-CM | POA: Diagnosis not present

## 2024-08-11 DIAGNOSIS — E1142 Type 2 diabetes mellitus with diabetic polyneuropathy: Secondary | ICD-10-CM | POA: Diagnosis not present

## 2024-08-19 DIAGNOSIS — E119 Type 2 diabetes mellitus without complications: Secondary | ICD-10-CM | POA: Diagnosis not present

## 2024-08-19 DIAGNOSIS — Z794 Long term (current) use of insulin: Secondary | ICD-10-CM | POA: Diagnosis not present

## 2024-09-27 NOTE — Progress Notes (Signed)
 [name]                                          MRN: [id number]   09/27/2024   The [facility] Quality Team Specialist reviewed this patient medical record for the purposes of chart review for care gap closure. The following were reviewed: abstraction for care gap closure-glycemic status assessment.    [facility] Quality Team

## 2024-11-14 NOTE — Progress Notes (Signed)
 [name]                                          MRN: [id number]   11/14/2024   The [facility] Quality Team Specialist reviewed this patient medical record for the purposes of chart review for care gap closure. The following were reviewed: abstraction for care gap closure-glycemic status assessment.    [facility] Quality Team
